# Patient Record
Sex: Male | Born: 1945 | Race: White | Hispanic: No
Health system: Southern US, Community
[De-identification: ages and names within clinical notes are randomized; demographics above are authoritative.]

---

## 1998-11-29 ENCOUNTER — Emergency Department (HOSPITAL_COMMUNITY): Admission: EM | Admit: 1998-11-29 | Discharge: 1998-11-29 | Payer: Self-pay | Admitting: Emergency Medicine

## 2002-02-19 ENCOUNTER — Inpatient Hospital Stay (HOSPITAL_COMMUNITY): Admission: EM | Admit: 2002-02-19 | Discharge: 2002-02-22 | Payer: Self-pay

## 2002-02-21 ENCOUNTER — Encounter: Payer: Self-pay | Admitting: Cardiovascular Disease

## 2002-02-21 ENCOUNTER — Encounter: Payer: Self-pay | Admitting: Family Medicine

## 2002-03-15 ENCOUNTER — Encounter: Admission: RE | Admit: 2002-03-15 | Discharge: 2002-03-15 | Payer: Self-pay | Admitting: Family Medicine

## 2002-03-16 ENCOUNTER — Encounter: Admission: RE | Admit: 2002-03-16 | Discharge: 2002-03-16 | Payer: Self-pay | Admitting: Family Medicine

## 2002-05-19 ENCOUNTER — Encounter: Payer: Self-pay | Admitting: Gastroenterology

## 2002-05-19 ENCOUNTER — Encounter: Admission: RE | Admit: 2002-05-19 | Discharge: 2002-05-19 | Payer: Self-pay | Admitting: Gastroenterology

## 2002-05-20 ENCOUNTER — Ambulatory Visit (HOSPITAL_COMMUNITY): Admission: RE | Admit: 2002-05-20 | Discharge: 2002-05-20 | Payer: Self-pay | Admitting: Gastroenterology

## 2002-07-14 ENCOUNTER — Encounter: Admission: RE | Admit: 2002-07-14 | Discharge: 2002-07-14 | Payer: Self-pay | Admitting: Sports Medicine

## 2002-08-15 ENCOUNTER — Encounter: Admission: RE | Admit: 2002-08-15 | Discharge: 2002-08-15 | Payer: Self-pay | Admitting: Family Medicine

## 2002-10-10 ENCOUNTER — Encounter: Admission: RE | Admit: 2002-10-10 | Discharge: 2002-10-10 | Payer: Self-pay | Admitting: Gastroenterology

## 2002-10-10 ENCOUNTER — Encounter: Payer: Self-pay | Admitting: Gastroenterology

## 2003-07-18 ENCOUNTER — Encounter: Admission: RE | Admit: 2003-07-18 | Discharge: 2003-07-18 | Payer: Self-pay | Admitting: Family Medicine

## 2003-07-26 ENCOUNTER — Encounter: Admission: RE | Admit: 2003-07-26 | Discharge: 2003-10-24 | Payer: Self-pay | Admitting: Family Medicine

## 2003-08-01 ENCOUNTER — Encounter: Admission: RE | Admit: 2003-08-01 | Discharge: 2003-08-01 | Payer: Self-pay | Admitting: Sports Medicine

## 2003-08-15 ENCOUNTER — Encounter: Admission: RE | Admit: 2003-08-15 | Discharge: 2003-08-15 | Payer: Self-pay | Admitting: Family Medicine

## 2003-09-13 ENCOUNTER — Ambulatory Visit: Payer: Self-pay

## 2003-10-19 ENCOUNTER — Ambulatory Visit: Payer: Self-pay | Admitting: Family Medicine

## 2003-11-02 ENCOUNTER — Inpatient Hospital Stay (HOSPITAL_COMMUNITY): Admission: RE | Admit: 2003-11-02 | Discharge: 2003-11-05 | Payer: Self-pay | Admitting: Orthopaedic Surgery

## 2004-01-25 ENCOUNTER — Ambulatory Visit: Payer: Self-pay | Admitting: Family Medicine

## 2004-02-08 ENCOUNTER — Ambulatory Visit: Payer: Self-pay | Admitting: Family Medicine

## 2004-02-16 ENCOUNTER — Ambulatory Visit: Payer: Self-pay | Admitting: Family Medicine

## 2004-02-20 ENCOUNTER — Encounter: Admission: RE | Admit: 2004-02-20 | Discharge: 2004-02-20 | Payer: Self-pay | Admitting: Gastroenterology

## 2004-02-28 ENCOUNTER — Ambulatory Visit: Payer: Self-pay | Admitting: Family Medicine

## 2004-07-02 ENCOUNTER — Ambulatory Visit: Payer: Self-pay | Admitting: Sports Medicine

## 2004-07-08 ENCOUNTER — Ambulatory Visit (HOSPITAL_COMMUNITY): Admission: RE | Admit: 2004-07-08 | Discharge: 2004-07-08 | Payer: Self-pay | Admitting: Family Medicine

## 2004-07-08 ENCOUNTER — Ambulatory Visit: Payer: Self-pay | Admitting: Family Medicine

## 2004-07-10 ENCOUNTER — Encounter: Admission: RE | Admit: 2004-07-10 | Discharge: 2004-07-10 | Payer: Self-pay | Admitting: Sports Medicine

## 2004-07-12 ENCOUNTER — Ambulatory Visit: Payer: Self-pay | Admitting: Cardiology

## 2004-07-24 ENCOUNTER — Ambulatory Visit: Payer: Self-pay | Admitting: Family Medicine

## 2004-07-26 ENCOUNTER — Ambulatory Visit: Payer: Self-pay

## 2004-07-26 ENCOUNTER — Ambulatory Visit: Payer: Self-pay | Admitting: Cardiology

## 2004-08-09 ENCOUNTER — Ambulatory Visit: Payer: Self-pay | Admitting: Cardiology

## 2004-08-16 ENCOUNTER — Ambulatory Visit: Payer: Self-pay | Admitting: Family Medicine

## 2004-09-04 ENCOUNTER — Ambulatory Visit: Payer: Self-pay | Admitting: Family Medicine

## 2004-10-03 ENCOUNTER — Ambulatory Visit: Payer: Self-pay | Admitting: Cardiology

## 2005-01-30 ENCOUNTER — Ambulatory Visit: Payer: Self-pay | Admitting: Sports Medicine

## 2005-02-07 ENCOUNTER — Ambulatory Visit (HOSPITAL_COMMUNITY): Admission: RE | Admit: 2005-02-07 | Discharge: 2005-02-07 | Payer: Self-pay | Admitting: Family Medicine

## 2005-02-10 ENCOUNTER — Ambulatory Visit: Payer: Self-pay | Admitting: Family Medicine

## 2005-03-05 ENCOUNTER — Ambulatory Visit: Payer: Self-pay | Admitting: Family Medicine

## 2005-04-03 ENCOUNTER — Emergency Department (HOSPITAL_COMMUNITY): Admission: EM | Admit: 2005-04-03 | Discharge: 2005-04-03 | Payer: Self-pay | Admitting: Emergency Medicine

## 2005-07-18 ENCOUNTER — Ambulatory Visit (HOSPITAL_COMMUNITY): Admission: RE | Admit: 2005-07-18 | Discharge: 2005-07-18 | Payer: Self-pay | Admitting: Family Medicine

## 2005-07-18 ENCOUNTER — Ambulatory Visit: Payer: Self-pay | Admitting: Family Medicine

## 2005-08-20 ENCOUNTER — Ambulatory Visit: Payer: Self-pay | Admitting: Family Medicine

## 2005-08-21 ENCOUNTER — Ambulatory Visit: Payer: Self-pay | Admitting: Cardiology

## 2005-08-21 ENCOUNTER — Ambulatory Visit (HOSPITAL_COMMUNITY): Admission: RE | Admit: 2005-08-21 | Discharge: 2005-08-21 | Payer: Self-pay | Admitting: Cardiology

## 2005-09-03 ENCOUNTER — Ambulatory Visit: Payer: Self-pay | Admitting: Oncology

## 2005-09-12 LAB — COMPREHENSIVE METABOLIC PANEL
ALT: 48 U/L — ABNORMAL HIGH (ref 0–40)
AST: 66 U/L — ABNORMAL HIGH (ref 0–37)
Chloride: 101 mEq/L (ref 96–112)
Creatinine, Ser: 0.82 mg/dL (ref 0.40–1.50)
Sodium: 137 mEq/L (ref 135–145)
Total Bilirubin: 2.5 mg/dL — ABNORMAL HIGH (ref 0.3–1.2)
Total Protein: 8 g/dL (ref 6.0–8.3)

## 2005-09-12 LAB — CBC WITH DIFFERENTIAL/PLATELET
BASO%: 0.3 % (ref 0.0–2.0)
Basophils Absolute: 0 10*3/uL (ref 0.0–0.1)
HCT: 37 % — ABNORMAL LOW (ref 38.7–49.9)
LYMPH%: 52.8 % — ABNORMAL HIGH (ref 14.0–48.0)
MCH: 32.3 pg (ref 28.0–33.4)
MCHC: 34.7 g/dL (ref 32.0–35.9)
MONO#: 0.5 10*3/uL (ref 0.1–0.9)
NEUT%: 34.9 % — ABNORMAL LOW (ref 40.0–75.0)
Platelets: 88 10*3/uL — ABNORMAL LOW (ref 145–400)
WBC: 5.7 10*3/uL (ref 4.0–10.0)

## 2005-09-12 LAB — CHCC SMEAR

## 2005-09-12 LAB — TECHNOLOGIST REVIEW

## 2005-10-07 ENCOUNTER — Ambulatory Visit: Payer: Self-pay | Admitting: *Deleted

## 2005-10-17 ENCOUNTER — Ambulatory Visit: Payer: Self-pay

## 2005-10-30 ENCOUNTER — Ambulatory Visit: Payer: Self-pay | Admitting: Cardiology

## 2006-01-27 ENCOUNTER — Ambulatory Visit (HOSPITAL_BASED_OUTPATIENT_CLINIC_OR_DEPARTMENT_OTHER): Admission: RE | Admit: 2006-01-27 | Discharge: 2006-01-27 | Payer: Self-pay | Admitting: Orthopaedic Surgery

## 2006-02-10 ENCOUNTER — Encounter (INDEPENDENT_AMBULATORY_CARE_PROVIDER_SITE_OTHER): Payer: Self-pay | Admitting: Family Medicine

## 2006-02-10 ENCOUNTER — Ambulatory Visit: Payer: Self-pay | Admitting: Family Medicine

## 2006-02-10 LAB — CONVERTED CEMR LAB
INR: 1.2 (ref 0.0–1.5)
aPTT: 32 s (ref 24–37)

## 2006-02-15 ENCOUNTER — Emergency Department (HOSPITAL_COMMUNITY): Admission: EM | Admit: 2006-02-15 | Discharge: 2006-02-15 | Payer: Self-pay | Admitting: Emergency Medicine

## 2006-02-19 ENCOUNTER — Ambulatory Visit: Payer: Self-pay | Admitting: Family Medicine

## 2006-02-24 ENCOUNTER — Ambulatory Visit: Payer: Self-pay | Admitting: *Deleted

## 2006-02-24 ENCOUNTER — Ambulatory Visit (HOSPITAL_COMMUNITY): Admission: RE | Admit: 2006-02-24 | Discharge: 2006-02-24 | Payer: Self-pay | Admitting: Sports Medicine

## 2006-03-13 ENCOUNTER — Ambulatory Visit: Payer: Self-pay | Admitting: Sports Medicine

## 2006-03-13 DIAGNOSIS — I739 Peripheral vascular disease, unspecified: Secondary | ICD-10-CM

## 2006-03-17 ENCOUNTER — Encounter (INDEPENDENT_AMBULATORY_CARE_PROVIDER_SITE_OTHER): Payer: Self-pay | Admitting: Family Medicine

## 2006-03-18 ENCOUNTER — Ambulatory Visit (HOSPITAL_COMMUNITY): Admission: RE | Admit: 2006-03-18 | Discharge: 2006-03-18 | Payer: Self-pay | Admitting: Gastroenterology

## 2006-03-18 ENCOUNTER — Encounter (INDEPENDENT_AMBULATORY_CARE_PROVIDER_SITE_OTHER): Payer: Self-pay | Admitting: Specialist

## 2006-04-15 ENCOUNTER — Ambulatory Visit: Payer: Self-pay | Admitting: Family Medicine

## 2006-04-15 DIAGNOSIS — I10 Essential (primary) hypertension: Secondary | ICD-10-CM | POA: Insufficient documentation

## 2006-04-30 ENCOUNTER — Ambulatory Visit: Payer: Self-pay | Admitting: Cardiology

## 2006-07-23 ENCOUNTER — Telehealth: Payer: Self-pay | Admitting: *Deleted

## 2006-07-24 ENCOUNTER — Encounter (INDEPENDENT_AMBULATORY_CARE_PROVIDER_SITE_OTHER): Payer: Self-pay | Admitting: Family Medicine

## 2006-07-24 ENCOUNTER — Ambulatory Visit: Payer: Self-pay | Admitting: Family Medicine

## 2006-07-24 DIAGNOSIS — I872 Venous insufficiency (chronic) (peripheral): Secondary | ICD-10-CM | POA: Insufficient documentation

## 2006-07-24 DIAGNOSIS — D696 Thrombocytopenia, unspecified: Secondary | ICD-10-CM | POA: Insufficient documentation

## 2006-07-28 LAB — CONVERTED CEMR LAB
BUN: 14 mg/dL (ref 6–23)
Chloride: 100 meq/L (ref 96–112)
Creatinine, Ser: 0.87 mg/dL (ref 0.40–1.50)
MCHC: 32.9 g/dL (ref 30.0–36.0)
Platelets: 75 10*3/uL — ABNORMAL LOW (ref 150–400)
Potassium: 4.1 meq/L (ref 3.5–5.3)
RDW: 14.8 % — ABNORMAL HIGH (ref 11.5–14.0)

## 2006-08-05 ENCOUNTER — Encounter (INDEPENDENT_AMBULATORY_CARE_PROVIDER_SITE_OTHER): Payer: Self-pay | Admitting: Family Medicine

## 2006-08-05 ENCOUNTER — Telehealth: Payer: Self-pay | Admitting: *Deleted

## 2006-08-05 ENCOUNTER — Ambulatory Visit: Payer: Self-pay | Admitting: Family Medicine

## 2006-08-05 LAB — CONVERTED CEMR LAB
BUN: 13 mg/dL (ref 6–23)
Chloride: 105 meq/L (ref 96–112)
Glucose, Bld: 93 mg/dL (ref 70–99)
Potassium: 4.1 meq/L (ref 3.5–5.3)
Sodium: 138 meq/L (ref 135–145)

## 2006-08-06 ENCOUNTER — Telehealth: Payer: Self-pay | Admitting: *Deleted

## 2006-08-28 ENCOUNTER — Encounter (HOSPITAL_BASED_OUTPATIENT_CLINIC_OR_DEPARTMENT_OTHER): Admission: RE | Admit: 2006-08-28 | Discharge: 2006-09-10 | Payer: Self-pay | Admitting: Surgery

## 2006-09-01 ENCOUNTER — Encounter (INDEPENDENT_AMBULATORY_CARE_PROVIDER_SITE_OTHER): Payer: Self-pay | Admitting: Family Medicine

## 2006-09-01 ENCOUNTER — Ambulatory Visit: Payer: Self-pay | Admitting: Family Medicine

## 2006-09-01 LAB — CONVERTED CEMR LAB
CO2: 25 meq/L (ref 19–32)
Calcium: 8.9 mg/dL (ref 8.4–10.5)
Creatinine, Ser: 0.77 mg/dL (ref 0.40–1.50)
Glucose, Bld: 88 mg/dL (ref 70–99)
HCT: 41.5 % (ref 39.0–52.0)
MCHC: 33.5 g/dL (ref 30.0–36.0)
MCV: 90.2 fL (ref 78.0–100.0)
RBC: 4.6 M/uL (ref 4.22–5.81)
Sodium: 138 meq/L (ref 135–145)
WBC: 5.2 10*3/uL (ref 4.0–10.5)

## 2006-09-30 ENCOUNTER — Ambulatory Visit: Payer: Self-pay | Admitting: Family Medicine

## 2006-10-29 ENCOUNTER — Ambulatory Visit: Payer: Self-pay | Admitting: Family Medicine

## 2006-10-29 ENCOUNTER — Encounter (INDEPENDENT_AMBULATORY_CARE_PROVIDER_SITE_OTHER): Payer: Self-pay | Admitting: Family Medicine

## 2006-10-29 DIAGNOSIS — B182 Chronic viral hepatitis C: Secondary | ICD-10-CM

## 2006-10-29 DIAGNOSIS — K766 Portal hypertension: Secondary | ICD-10-CM | POA: Insufficient documentation

## 2006-10-29 LAB — CONVERTED CEMR LAB
Hemoglobin: 13.2 g/dL (ref 13.0–17.0)
MCHC: 32.2 g/dL (ref 30.0–36.0)
PSA: NORMAL ng/mL
PSA: 0.29 ng/mL (ref 0.10–4.00)
RDW: 14.9 % — ABNORMAL HIGH (ref 11.5–14.0)

## 2006-11-02 ENCOUNTER — Encounter (INDEPENDENT_AMBULATORY_CARE_PROVIDER_SITE_OTHER): Payer: Self-pay | Admitting: Family Medicine

## 2006-11-06 ENCOUNTER — Encounter (INDEPENDENT_AMBULATORY_CARE_PROVIDER_SITE_OTHER): Payer: Self-pay | Admitting: Family Medicine

## 2006-11-16 ENCOUNTER — Encounter (INDEPENDENT_AMBULATORY_CARE_PROVIDER_SITE_OTHER): Payer: Self-pay | Admitting: Family Medicine

## 2006-12-10 ENCOUNTER — Encounter (INDEPENDENT_AMBULATORY_CARE_PROVIDER_SITE_OTHER): Payer: Self-pay | Admitting: Family Medicine

## 2006-12-15 ENCOUNTER — Ambulatory Visit: Payer: Self-pay | Admitting: Family Medicine

## 2007-02-02 ENCOUNTER — Ambulatory Visit: Payer: Self-pay | Admitting: Family Medicine

## 2007-02-02 ENCOUNTER — Encounter (INDEPENDENT_AMBULATORY_CARE_PROVIDER_SITE_OTHER): Payer: Self-pay | Admitting: *Deleted

## 2007-02-02 DIAGNOSIS — J449 Chronic obstructive pulmonary disease, unspecified: Secondary | ICD-10-CM

## 2007-02-02 DIAGNOSIS — J4489 Other specified chronic obstructive pulmonary disease: Secondary | ICD-10-CM | POA: Insufficient documentation

## 2007-02-03 ENCOUNTER — Encounter (INDEPENDENT_AMBULATORY_CARE_PROVIDER_SITE_OTHER): Payer: Self-pay | Admitting: *Deleted

## 2007-02-03 LAB — CONVERTED CEMR LAB
Albumin: 3.8 g/dL (ref 3.5–5.2)
Alkaline Phosphatase: 63 units/L (ref 39–117)
BUN: 18 mg/dL (ref 6–23)
Calcium: 9 mg/dL (ref 8.4–10.5)
Chloride: 98 meq/L (ref 96–112)
Eosinophils Absolute: 0.1 10*3/uL (ref 0.0–0.7)
Glucose, Bld: 105 mg/dL — ABNORMAL HIGH (ref 70–99)
Lymphocytes Relative: 42 % (ref 12–46)
Lymphs Abs: 2.9 10*3/uL (ref 0.7–4.0)
MCV: 91.5 fL (ref 78.0–100.0)
Monocytes Relative: 13 % — ABNORMAL HIGH (ref 3–12)
Neutro Abs: 3 10*3/uL (ref 1.7–7.7)
Neutrophils Relative %: 43 % (ref 43–77)
Platelets: 84 10*3/uL — ABNORMAL LOW (ref 150–400)
Potassium: 4.4 meq/L (ref 3.5–5.3)
RBC: 4.22 M/uL (ref 4.22–5.81)
Sodium: 136 meq/L (ref 135–145)
Total Protein: 8.7 g/dL — ABNORMAL HIGH (ref 6.0–8.3)
WBC: 6.9 10*3/uL (ref 4.0–10.5)

## 2007-02-22 ENCOUNTER — Telehealth: Payer: Self-pay | Admitting: *Deleted

## 2007-02-26 ENCOUNTER — Ambulatory Visit: Payer: Self-pay | Admitting: Family Medicine

## 2007-02-26 DIAGNOSIS — K7030 Alcoholic cirrhosis of liver without ascites: Secondary | ICD-10-CM

## 2007-02-26 DIAGNOSIS — F411 Generalized anxiety disorder: Secondary | ICD-10-CM | POA: Insufficient documentation

## 2007-03-01 ENCOUNTER — Ambulatory Visit: Payer: Self-pay | Admitting: Sports Medicine

## 2007-03-01 ENCOUNTER — Encounter (INDEPENDENT_AMBULATORY_CARE_PROVIDER_SITE_OTHER): Payer: Self-pay | Admitting: Family Medicine

## 2007-03-01 ENCOUNTER — Encounter (INDEPENDENT_AMBULATORY_CARE_PROVIDER_SITE_OTHER): Payer: Self-pay | Admitting: *Deleted

## 2007-03-02 ENCOUNTER — Encounter (INDEPENDENT_AMBULATORY_CARE_PROVIDER_SITE_OTHER): Payer: Self-pay | Admitting: *Deleted

## 2007-03-02 LAB — CONVERTED CEMR LAB
ALT: 40 units/L (ref 0–53)
AST: 54 units/L — ABNORMAL HIGH (ref 0–37)
Basophils Relative: 0 % (ref 0–1)
Creatinine, Ser: 0.99 mg/dL (ref 0.40–1.50)
Eosinophils Absolute: 0.1 10*3/uL (ref 0.0–0.7)
INR: 1.2 (ref 0.0–1.5)
Lymphs Abs: 2.6 10*3/uL (ref 0.7–4.0)
MCHC: 33.8 g/dL (ref 30.0–36.0)
MCV: 92.2 fL (ref 78.0–100.0)
Neutro Abs: 2.3 10*3/uL (ref 1.7–7.7)
Neutrophils Relative %: 42 % — ABNORMAL LOW (ref 43–77)
Platelets: 63 10*3/uL — ABNORMAL LOW (ref 150–400)
Prothrombin Time: 15.2 s (ref 11.6–15.2)
Sodium: 136 meq/L (ref 135–145)
Total Bilirubin: 2.1 mg/dL — ABNORMAL HIGH (ref 0.3–1.2)
WBC: 5.3 10*3/uL (ref 4.0–10.5)

## 2007-03-26 ENCOUNTER — Ambulatory Visit: Payer: Self-pay | Admitting: Family Medicine

## 2007-03-26 DIAGNOSIS — L821 Other seborrheic keratosis: Secondary | ICD-10-CM | POA: Insufficient documentation

## 2007-03-26 DIAGNOSIS — R413 Other amnesia: Secondary | ICD-10-CM

## 2007-03-26 DIAGNOSIS — D179 Benign lipomatous neoplasm, unspecified: Secondary | ICD-10-CM | POA: Insufficient documentation

## 2007-03-29 ENCOUNTER — Ambulatory Visit: Payer: Self-pay | Admitting: Sports Medicine

## 2007-03-29 ENCOUNTER — Encounter (INDEPENDENT_AMBULATORY_CARE_PROVIDER_SITE_OTHER): Payer: Self-pay | Admitting: *Deleted

## 2007-03-29 LAB — CONVERTED CEMR LAB
AST: 83 units/L — ABNORMAL HIGH (ref 0–37)
Albumin: 3.7 g/dL (ref 3.5–5.2)
BUN: 12 mg/dL (ref 6–23)
CO2: 27 meq/L (ref 19–32)
Calcium: 9 mg/dL (ref 8.4–10.5)
Chloride: 106 meq/L (ref 96–112)
Glucose, Bld: 102 mg/dL — ABNORMAL HIGH (ref 70–99)
Potassium: 4.6 meq/L (ref 3.5–5.3)

## 2007-03-30 ENCOUNTER — Encounter (INDEPENDENT_AMBULATORY_CARE_PROVIDER_SITE_OTHER): Payer: Self-pay | Admitting: *Deleted

## 2007-04-06 ENCOUNTER — Encounter: Payer: Self-pay | Admitting: Family Medicine

## 2007-04-06 ENCOUNTER — Ambulatory Visit: Payer: Self-pay | Admitting: Cardiology

## 2007-04-06 ENCOUNTER — Inpatient Hospital Stay (HOSPITAL_COMMUNITY): Admission: EM | Admit: 2007-04-06 | Discharge: 2007-04-16 | Payer: Self-pay | Admitting: Emergency Medicine

## 2007-04-06 ENCOUNTER — Ambulatory Visit: Payer: Self-pay | Admitting: Internal Medicine

## 2007-04-06 ENCOUNTER — Encounter (INDEPENDENT_AMBULATORY_CARE_PROVIDER_SITE_OTHER): Payer: Self-pay | Admitting: *Deleted

## 2007-04-07 ENCOUNTER — Encounter (INDEPENDENT_AMBULATORY_CARE_PROVIDER_SITE_OTHER): Payer: Self-pay | Admitting: Internal Medicine

## 2007-04-07 ENCOUNTER — Ambulatory Visit: Payer: Self-pay | Admitting: Vascular Surgery

## 2007-04-09 ENCOUNTER — Encounter: Payer: Self-pay | Admitting: Cardiology

## 2007-04-15 ENCOUNTER — Telehealth: Payer: Self-pay | Admitting: Family Medicine

## 2007-04-21 ENCOUNTER — Ambulatory Visit: Payer: Self-pay | Admitting: Family Medicine

## 2007-04-21 ENCOUNTER — Encounter (INDEPENDENT_AMBULATORY_CARE_PROVIDER_SITE_OTHER): Payer: Self-pay | Admitting: *Deleted

## 2007-04-21 DIAGNOSIS — I2510 Atherosclerotic heart disease of native coronary artery without angina pectoris: Secondary | ICD-10-CM | POA: Insufficient documentation

## 2007-04-23 LAB — CONVERTED CEMR LAB
AST: 96 units/L — ABNORMAL HIGH (ref 0–37)
Albumin: 3.6 g/dL (ref 3.5–5.2)
Alkaline Phosphatase: 80 units/L (ref 39–117)
Basophils Absolute: 0 10*3/uL (ref 0.0–0.1)
Glucose, Bld: 97 mg/dL (ref 70–99)
Hemoglobin: 13.3 g/dL (ref 13.0–17.0)
Lymphocytes Relative: 39 % (ref 12–46)
Monocytes Absolute: 0.5 10*3/uL (ref 0.1–1.0)
Neutro Abs: 4.5 10*3/uL (ref 1.7–7.7)
Potassium: 5.4 meq/L — ABNORMAL HIGH (ref 3.5–5.3)
RBC: 4.2 M/uL — ABNORMAL LOW (ref 4.22–5.81)
RDW: 15 % (ref 11.5–15.5)
Sodium: 140 meq/L (ref 135–145)
Total Protein: 8.8 g/dL — ABNORMAL HIGH (ref 6.0–8.3)
WBC: 8.3 10*3/uL (ref 4.0–10.5)

## 2007-05-26 ENCOUNTER — Ambulatory Visit: Payer: Self-pay | Admitting: Family Medicine

## 2007-06-16 ENCOUNTER — Encounter (INDEPENDENT_AMBULATORY_CARE_PROVIDER_SITE_OTHER): Payer: Self-pay | Admitting: *Deleted

## 2007-07-08 ENCOUNTER — Ambulatory Visit: Payer: Self-pay | Admitting: Family Medicine

## 2007-07-16 ENCOUNTER — Telehealth: Payer: Self-pay | Admitting: Family Medicine

## 2007-09-21 ENCOUNTER — Telehealth (INDEPENDENT_AMBULATORY_CARE_PROVIDER_SITE_OTHER): Payer: Self-pay | Admitting: *Deleted

## 2007-09-24 ENCOUNTER — Telehealth: Payer: Self-pay | Admitting: *Deleted

## 2007-11-02 ENCOUNTER — Telehealth: Payer: Self-pay | Admitting: *Deleted

## 2007-11-15 ENCOUNTER — Ambulatory Visit: Payer: Self-pay | Admitting: Family Medicine

## 2007-11-15 ENCOUNTER — Encounter: Payer: Self-pay | Admitting: Family Medicine

## 2007-11-17 LAB — CONVERTED CEMR LAB
ALT: 105 units/L — ABNORMAL HIGH (ref 0–53)
AST: 130 units/L — ABNORMAL HIGH (ref 0–37)
Basophils Absolute: 0 10*3/uL (ref 0.0–0.1)
Basophils Relative: 0 % (ref 0–1)
Creatinine, Ser: 0.89 mg/dL (ref 0.40–1.50)
Eosinophils Absolute: 0.2 10*3/uL (ref 0.0–0.7)
INR: 1.2 (ref 0.0–1.5)
MCHC: 32.4 g/dL (ref 30.0–36.0)
MCV: 90.5 fL (ref 78.0–100.0)
Neutrophils Relative %: 54 % (ref 43–77)
Platelets: 86 10*3/uL — ABNORMAL LOW (ref 150–400)
Prothrombin Time: 16 s — ABNORMAL HIGH (ref 11.6–15.2)
RDW: 15.6 % — ABNORMAL HIGH (ref 11.5–15.5)
Total Bilirubin: 1.4 mg/dL — ABNORMAL HIGH (ref 0.3–1.2)
WBC: 4.3 10*3/uL (ref 4.0–10.5)

## 2007-11-19 ENCOUNTER — Encounter: Payer: Self-pay | Admitting: Family Medicine

## 2007-11-30 ENCOUNTER — Ambulatory Visit: Payer: Self-pay | Admitting: Family Medicine

## 2007-11-30 DIAGNOSIS — Z860100 Personal history of colon polyps, unspecified: Secondary | ICD-10-CM | POA: Insufficient documentation

## 2007-11-30 LAB — CONVERTED CEMR LAB

## 2007-12-02 ENCOUNTER — Inpatient Hospital Stay (HOSPITAL_COMMUNITY): Admission: EM | Admit: 2007-12-02 | Discharge: 2007-12-10 | Payer: Self-pay | Admitting: Emergency Medicine

## 2007-12-02 ENCOUNTER — Ambulatory Visit: Payer: Self-pay | Admitting: Family Medicine

## 2007-12-02 ENCOUNTER — Encounter: Payer: Self-pay | Admitting: Family Medicine

## 2007-12-05 ENCOUNTER — Ambulatory Visit: Payer: Self-pay | Admitting: Gastroenterology

## 2007-12-16 ENCOUNTER — Ambulatory Visit: Payer: Self-pay | Admitting: Family Medicine

## 2007-12-16 DIAGNOSIS — R197 Diarrhea, unspecified: Secondary | ICD-10-CM

## 2007-12-16 DIAGNOSIS — F329 Major depressive disorder, single episode, unspecified: Secondary | ICD-10-CM

## 2007-12-16 DIAGNOSIS — K810 Acute cholecystitis: Secondary | ICD-10-CM | POA: Insufficient documentation

## 2007-12-21 ENCOUNTER — Ambulatory Visit: Payer: Self-pay | Admitting: Family Medicine

## 2007-12-21 ENCOUNTER — Telehealth: Payer: Self-pay | Admitting: *Deleted

## 2007-12-21 ENCOUNTER — Telehealth: Payer: Self-pay | Admitting: Family Medicine

## 2007-12-21 ENCOUNTER — Inpatient Hospital Stay (HOSPITAL_COMMUNITY): Admission: RE | Admit: 2007-12-21 | Discharge: 2008-01-06 | Payer: Self-pay | Admitting: Family Medicine

## 2007-12-21 ENCOUNTER — Encounter: Payer: Self-pay | Admitting: Family Medicine

## 2007-12-21 DIAGNOSIS — N179 Acute kidney failure, unspecified: Secondary | ICD-10-CM

## 2007-12-27 ENCOUNTER — Encounter: Payer: Self-pay | Admitting: Family Medicine

## 2007-12-28 ENCOUNTER — Ambulatory Visit: Payer: Self-pay | Admitting: Infectious Diseases

## 2008-01-03 ENCOUNTER — Encounter: Payer: Self-pay | Admitting: *Deleted

## 2008-01-06 ENCOUNTER — Encounter: Payer: Self-pay | Admitting: Family Medicine

## 2008-01-10 ENCOUNTER — Telehealth (INDEPENDENT_AMBULATORY_CARE_PROVIDER_SITE_OTHER): Payer: Self-pay | Admitting: Family Medicine

## 2008-01-10 ENCOUNTER — Ambulatory Visit (HOSPITAL_COMMUNITY): Admission: RE | Admit: 2008-01-10 | Discharge: 2008-01-10 | Payer: Self-pay | Admitting: Interventional Radiology

## 2008-01-13 ENCOUNTER — Ambulatory Visit: Payer: Self-pay | Admitting: Family Medicine

## 2008-01-13 ENCOUNTER — Encounter: Payer: Self-pay | Admitting: Family Medicine

## 2008-01-18 ENCOUNTER — Ambulatory Visit: Payer: Self-pay | Admitting: Family Medicine

## 2008-01-18 DIAGNOSIS — K859 Acute pancreatitis without necrosis or infection, unspecified: Secondary | ICD-10-CM | POA: Insufficient documentation

## 2008-01-18 LAB — CONVERTED CEMR LAB
ALT: 136 units/L — ABNORMAL HIGH (ref 0–53)
Albumin: 2.7 g/dL — ABNORMAL LOW (ref 3.5–5.2)
Alkaline Phosphatase: 107 units/L (ref 39–117)
CO2: 18 meq/L — ABNORMAL LOW (ref 19–32)
Glucose, Bld: 81 mg/dL (ref 70–99)
Potassium: 4.9 meq/L (ref 3.5–5.3)
Sodium: 138 meq/L (ref 135–145)
Total Protein: 7.6 g/dL (ref 6.0–8.3)

## 2008-01-19 ENCOUNTER — Encounter: Payer: Self-pay | Admitting: Family Medicine

## 2008-01-19 ENCOUNTER — Ambulatory Visit: Payer: Self-pay | Admitting: Family Medicine

## 2008-01-19 ENCOUNTER — Inpatient Hospital Stay (HOSPITAL_COMMUNITY): Admission: EM | Admit: 2008-01-19 | Discharge: 2008-01-27 | Payer: Self-pay | Admitting: Emergency Medicine

## 2008-01-19 DIAGNOSIS — R4182 Altered mental status, unspecified: Secondary | ICD-10-CM | POA: Insufficient documentation

## 2008-01-19 DIAGNOSIS — E875 Hyperkalemia: Secondary | ICD-10-CM

## 2008-01-28 ENCOUNTER — Telehealth (INDEPENDENT_AMBULATORY_CARE_PROVIDER_SITE_OTHER): Payer: Self-pay | Admitting: *Deleted

## 2008-01-29 ENCOUNTER — Telehealth (INDEPENDENT_AMBULATORY_CARE_PROVIDER_SITE_OTHER): Payer: Self-pay | Admitting: Family Medicine

## 2008-01-31 ENCOUNTER — Encounter: Payer: Self-pay | Admitting: Family Medicine

## 2008-02-03 ENCOUNTER — Encounter: Payer: Self-pay | Admitting: Family Medicine

## 2008-02-03 DIAGNOSIS — Z789 Other specified health status: Secondary | ICD-10-CM | POA: Insufficient documentation

## 2008-02-04 ENCOUNTER — Telehealth: Payer: Self-pay | Admitting: Family Medicine

## 2008-02-07 DEATH — deceased

## 2008-02-21 ENCOUNTER — Encounter: Payer: Self-pay | Admitting: Family Medicine

## 2009-01-09 IMAGING — US US ABDOMEN COMPLETE
1 series · 13 of 25 positions shown · non-contrast
Comparison: 12/04/2007

CLINICAL DATA: Vomiting.  Cholecystostomy tube placement
12/06/2007. Cirrhosis.

ABDOMEN ULTRASOUND
TECHNIQUE: Complete abdominal ultrasound examination was performed
including evaluation of the liver, gallbladder, bile ducts,
pancreas, kidneys, spleen, IVC, and abdominal aorta.

[Series 1: unknown · 0.33mm/px · 13 of 52 slices shown]
[im 1/52]
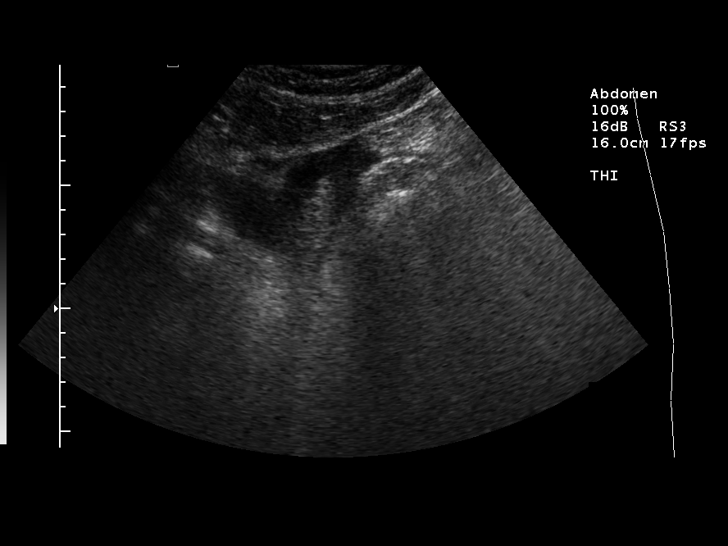
[im 5/52]
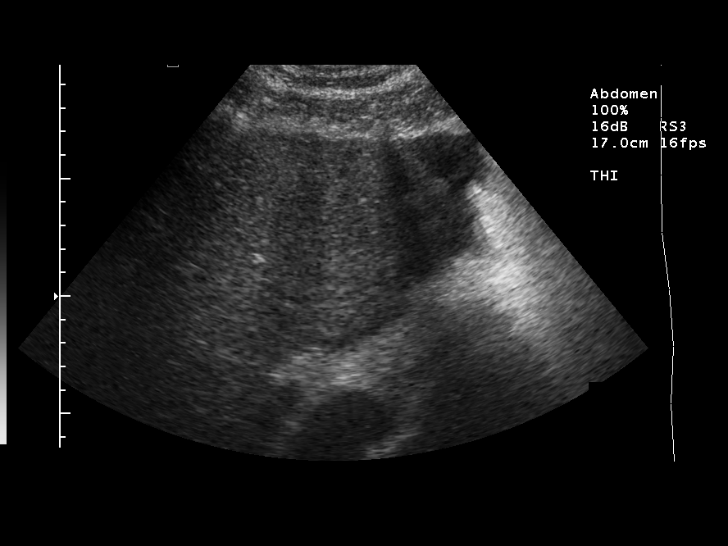
[im 9/52]
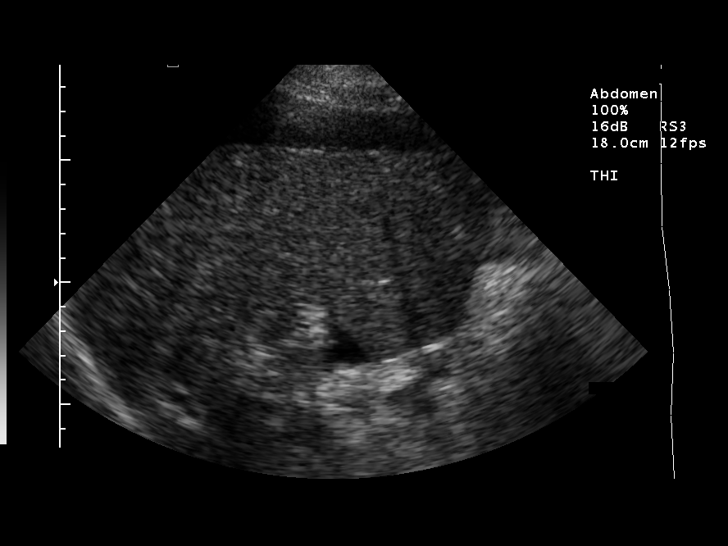
[im 13/52]
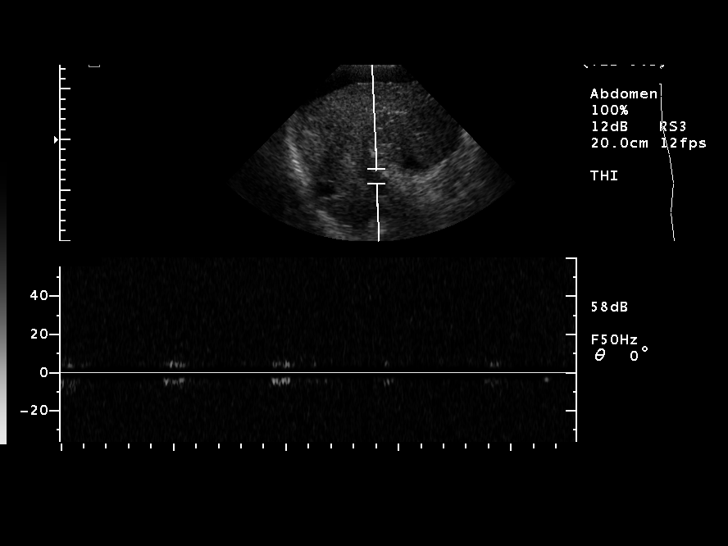
[im 18/52]
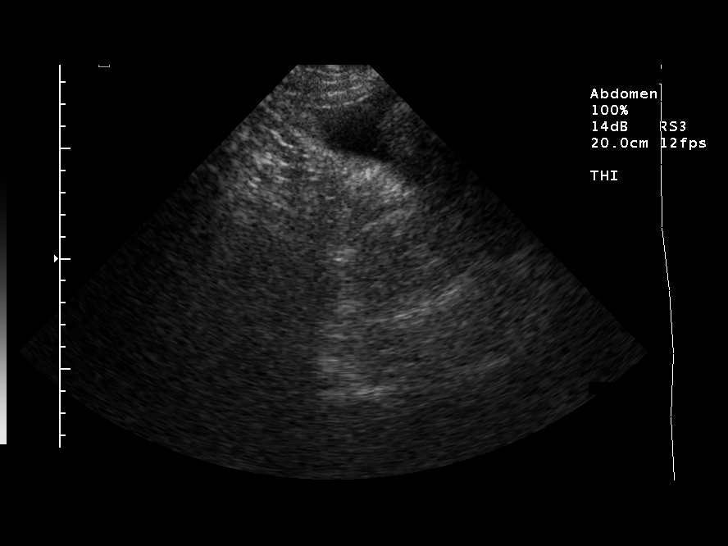
[im 22/52]
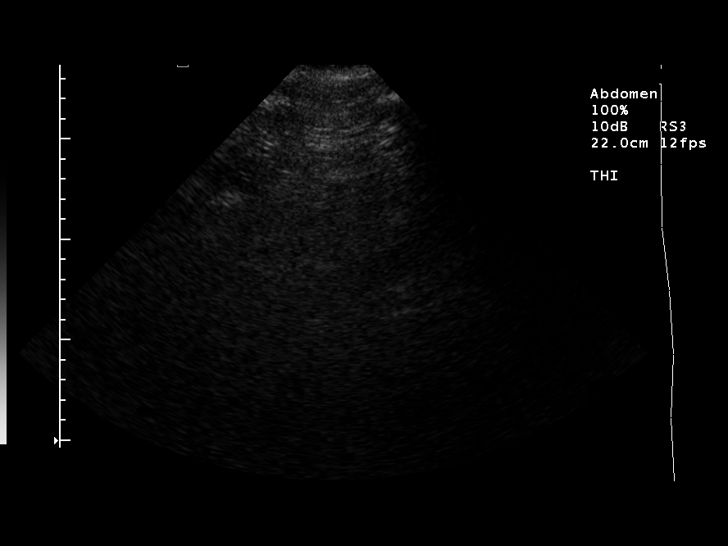
[im 26/52]
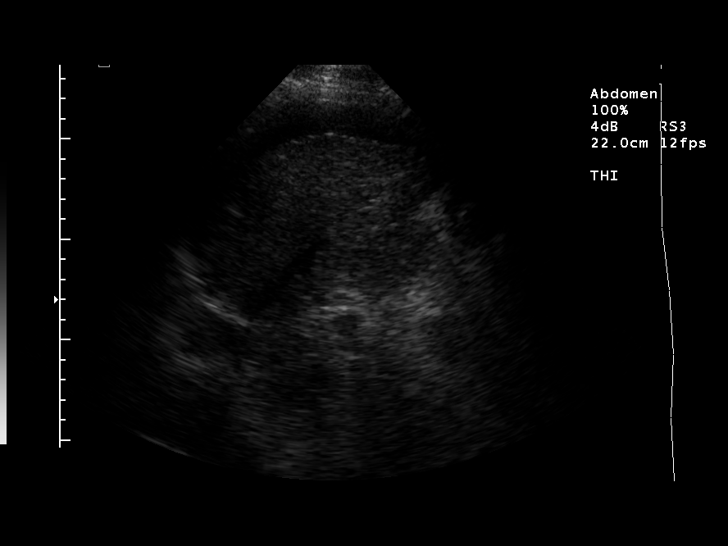
[im 30/52]
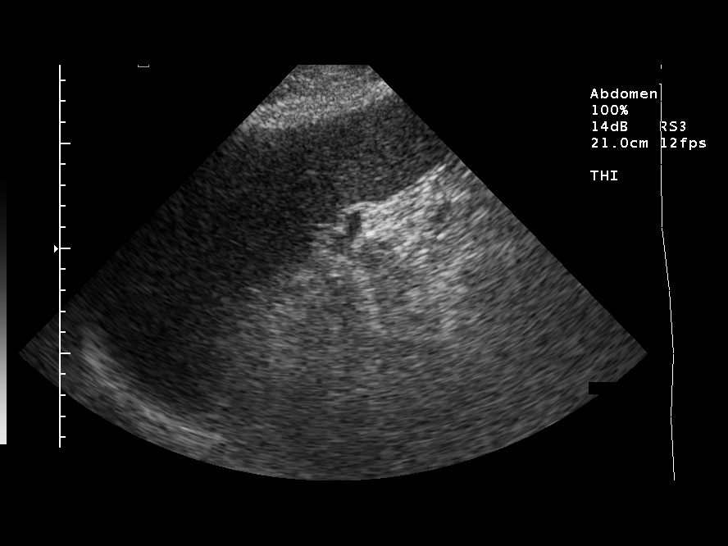
[im 35/52]
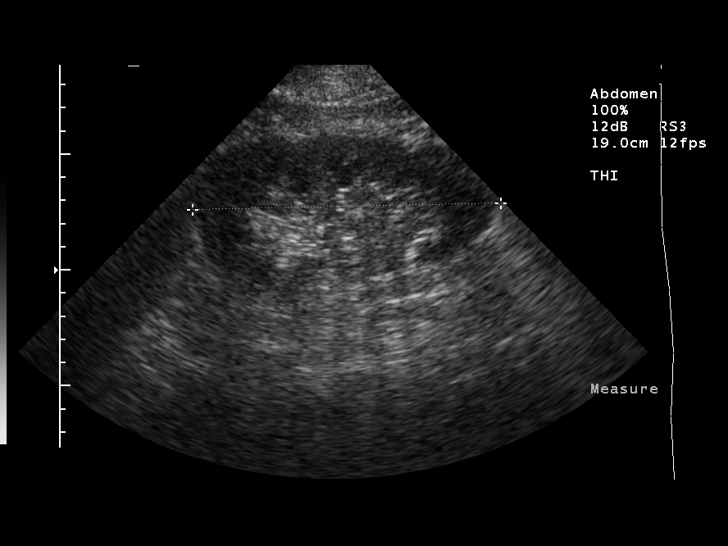
[im 39/52]
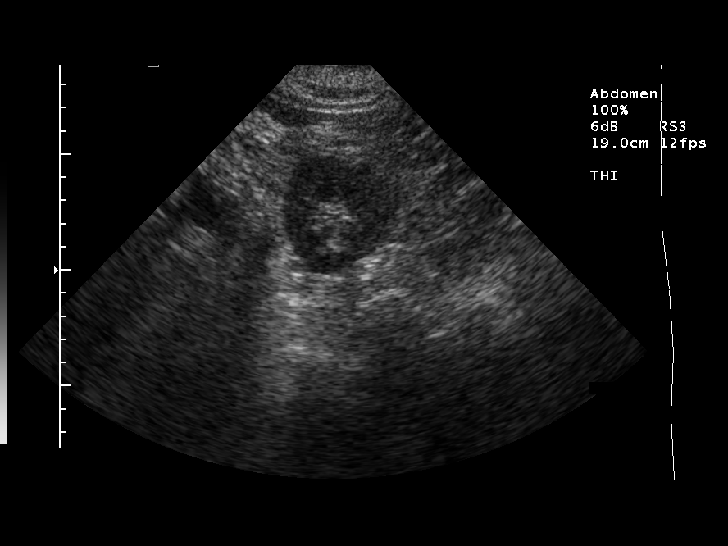
[im 43/52]
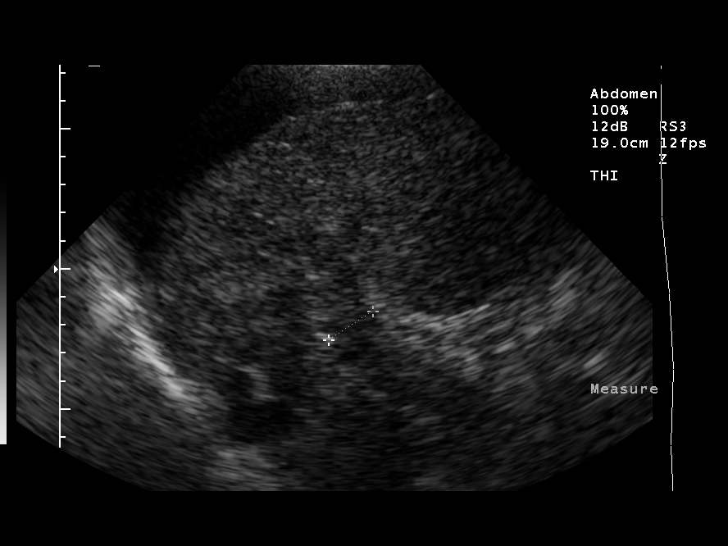
[im 47/52]
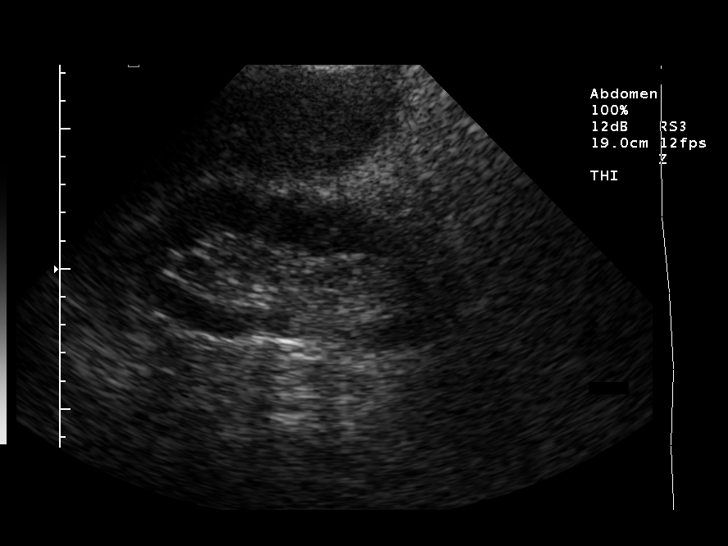
[im 52/52]
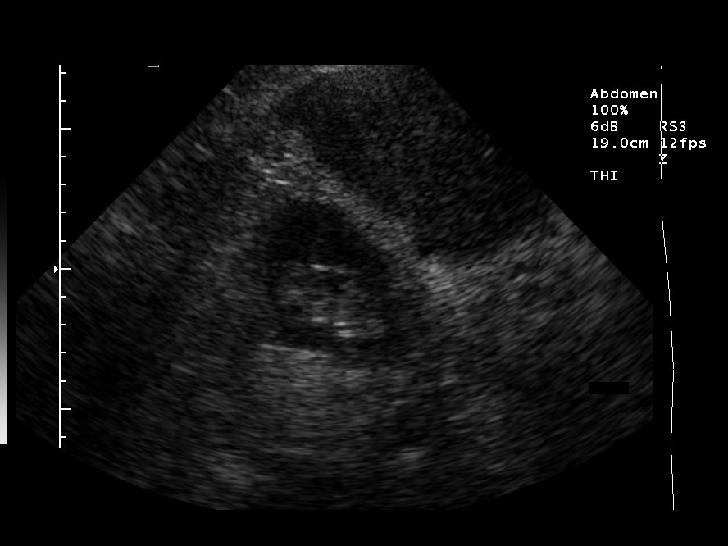

[13 of 25 positions shown; findings below may reference images not displayed]

FINDINGS: Gallbladder:  The gallbladder remains decompressed.  There is
concomitant apparent wall thickening up to 4.2 mm.

Common bile duct: Measures up to 7.4 mm diameter.

Liver:  Heterogeneous coarse echotexture with a somewhat nodular
contour.  Occlusion of the main portal vein is confirmed on color
and power Doppler.  No focal parenchymal abnormalities.

Inferior vena cava:  Patent.

Pancreas:  Largely obscured by overlying bowel gas.

Spleen:  Enlarged up to 19.2 cm in length without focal parenchymal
abnormalities.

Right kidney:  No hydronephrosis.   Normal parenchymal echotexture
without focal abnormalities.

Left kidney:  No hydronephrosis. Normal parenchymal echotexture
without focal abnormalities.

Abdominal aorta:  Visualized portions normal in caliber,
unremarkable.

A moderate amount of perihepatic ascites is noted.
IMPRESSION: 1.  Decompression of the gallbladder by the cholecystostomy
catheter.
2.  Hepatic parenchymal changes, splenomegaly, portal vein
occlusion, and ascites consistent with cirrhosis and portal venous
hypertension.

## 2009-01-28 IMAGING — XA IR CHOLANGIOGRAM VIA EXIST CATHETER
1 series · 13 of 24 positions shown · non-contrast
Comparison: Cholangiogram through existing tube on 12/30/2007

CLINICAL DATA: Follow up cholecystostomy tube.  Evaluate for
drainage into the duodenum.

CHOLANGIOGRAM VIA EXISTING CATHETER

[Series 1: run · 13 of 36 slices shown]
[im 1/36]
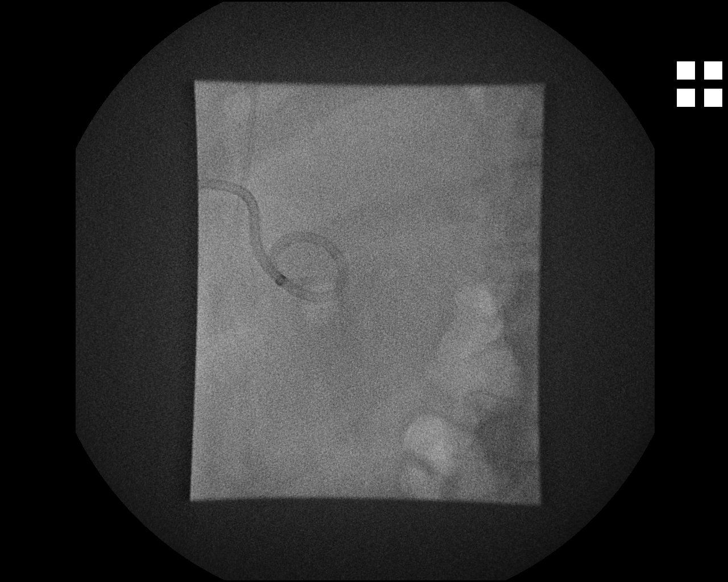
[im 4/36]
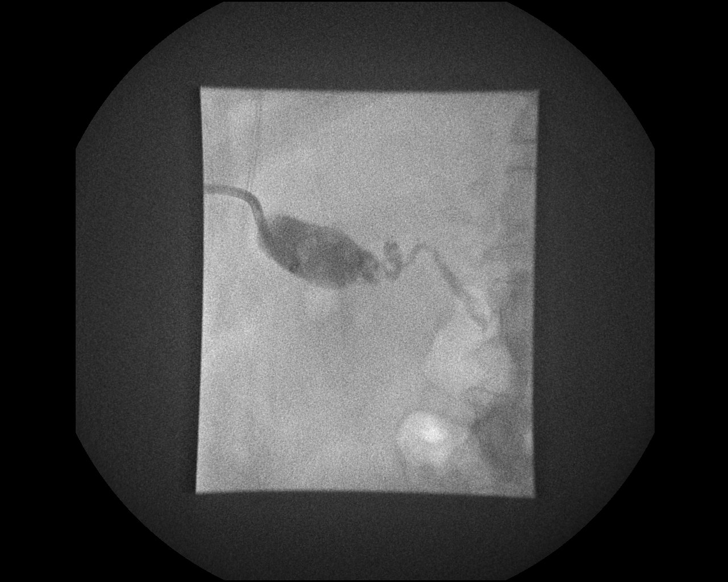
[im 7/36]
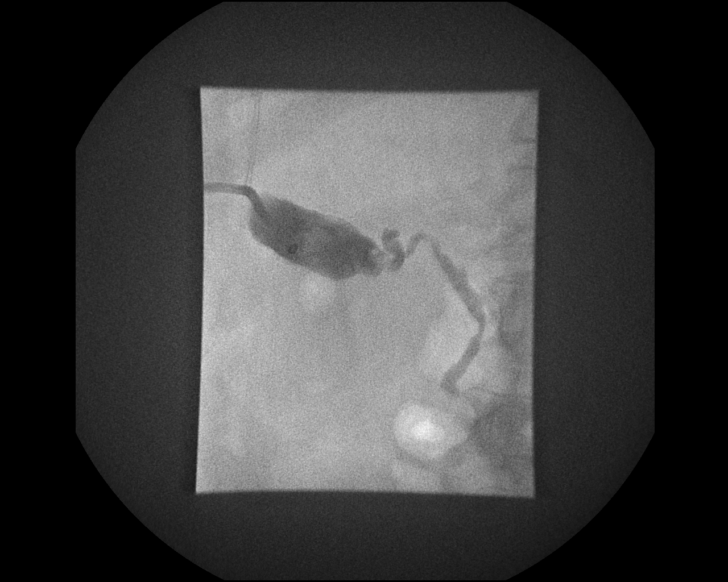
[im 10/36]
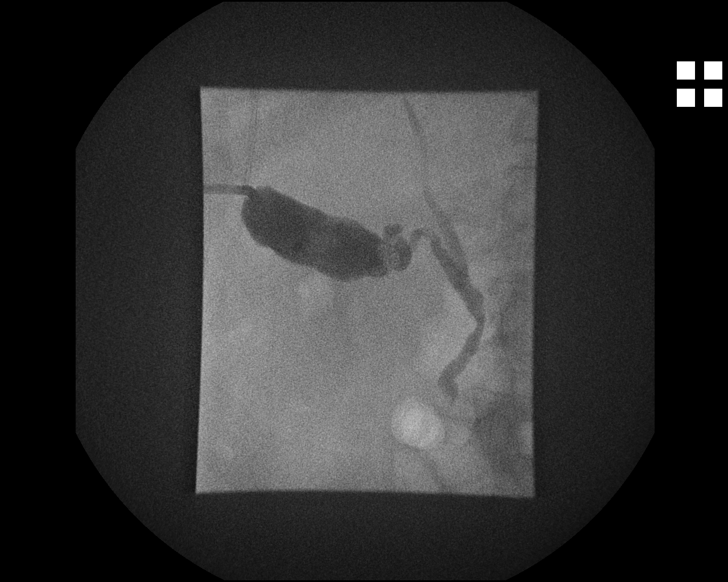
[im 13/36]
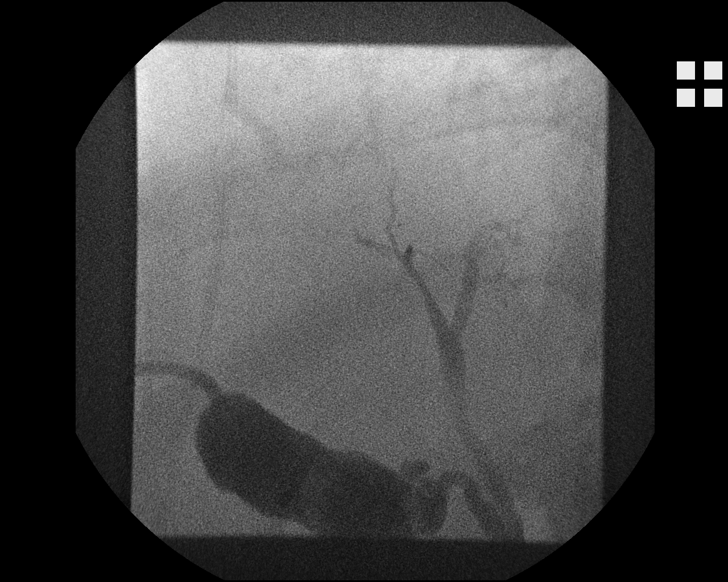
[im 16/36]
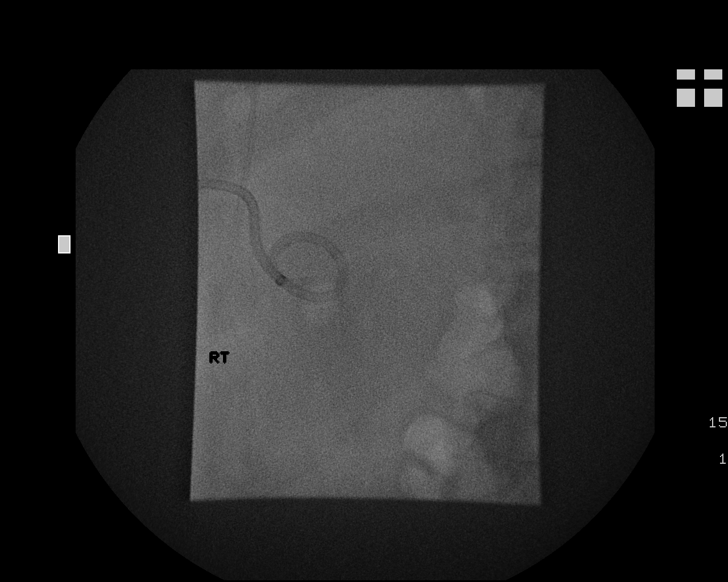
[im 19/36]
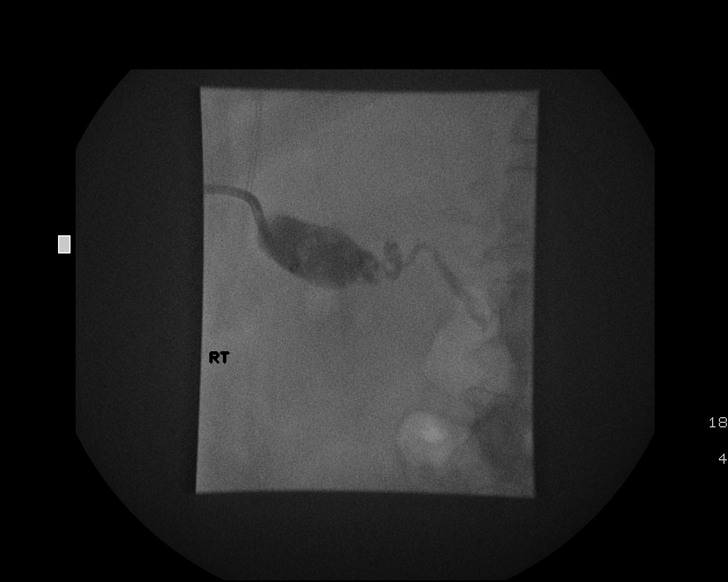
[im 20/36]
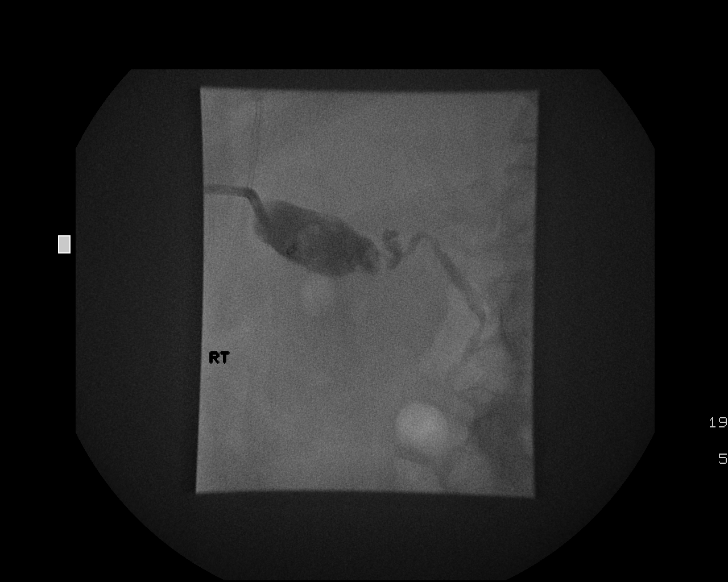
[im 23/36]
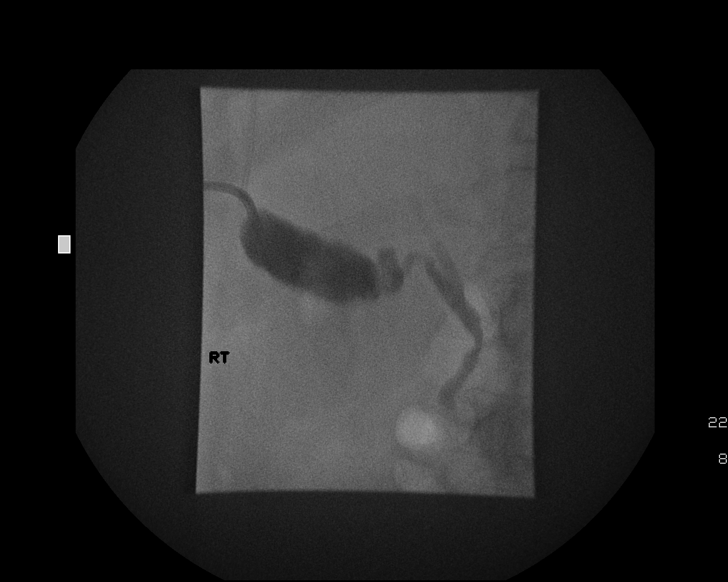
[im 26/36]
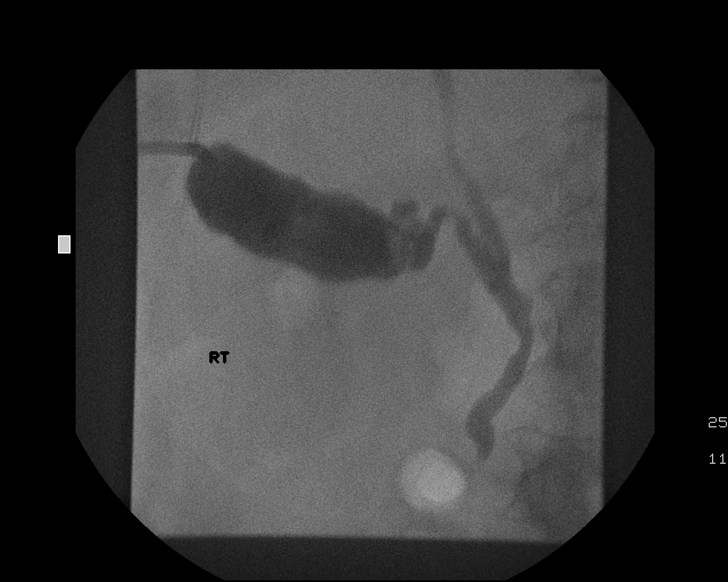
[im 29/36]
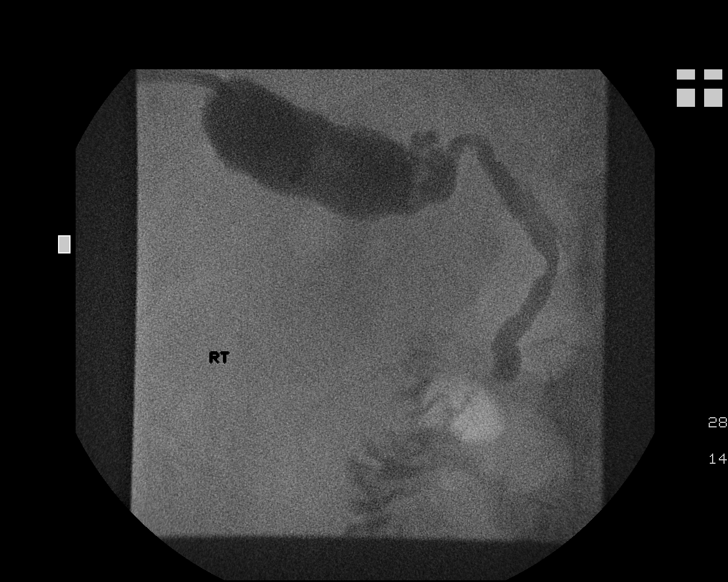
[im 32/36]
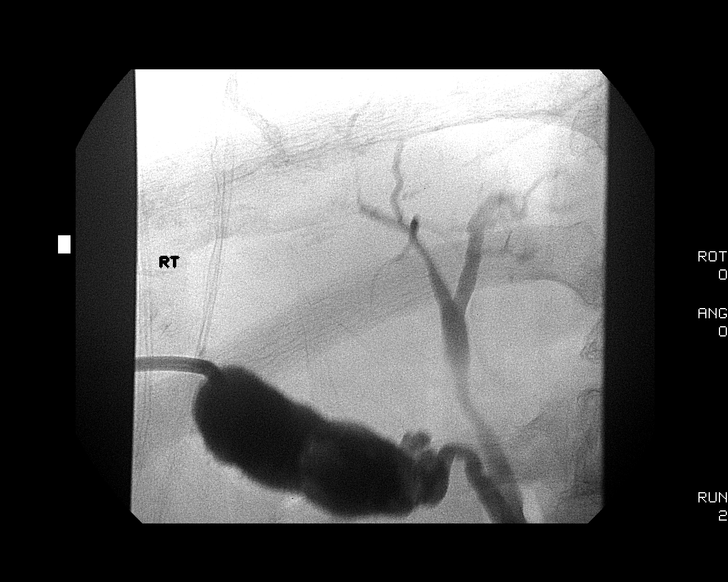
[im 36/36]
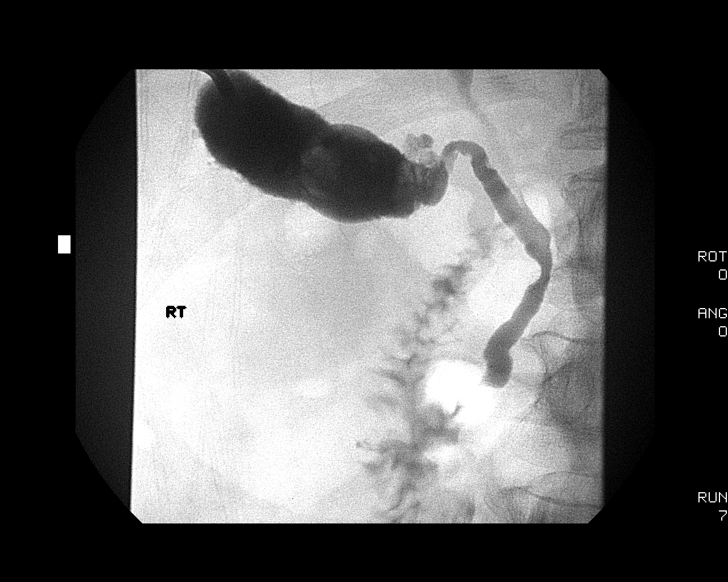

[13 of 24 positions shown; findings below may reference images not displayed]

FINDINGS: The catheter is positioned within the gallbladder.  The
cystic duct is patent.  There is filling of the common bile duct
with minor filling of intrahepatic biliary system.  There is
delayed emptying into the duodenum.  There is poor opacification of
the ampulla.  No definite stones in the common bile duct.
IMPRESSION: Catheter is positioned within the gallbladder.

Cystic duct is patent.

Contrast empties into the duodenum but there appears to be
narrowing at the ampulla. Findings are similar to the cholangiogram
from 12/23/2007. Findings could be related to a stricture but a
lesion cannot be completely excluded.

## 2009-02-06 IMAGING — CR DG CHEST 1V
1 series · 1 of 1 positions shown · non-contrast
Comparison: 12/02/2007

CLINICAL DATA: Altered mental status/weakness

CHEST - 1 VIEW

[view not recorded]
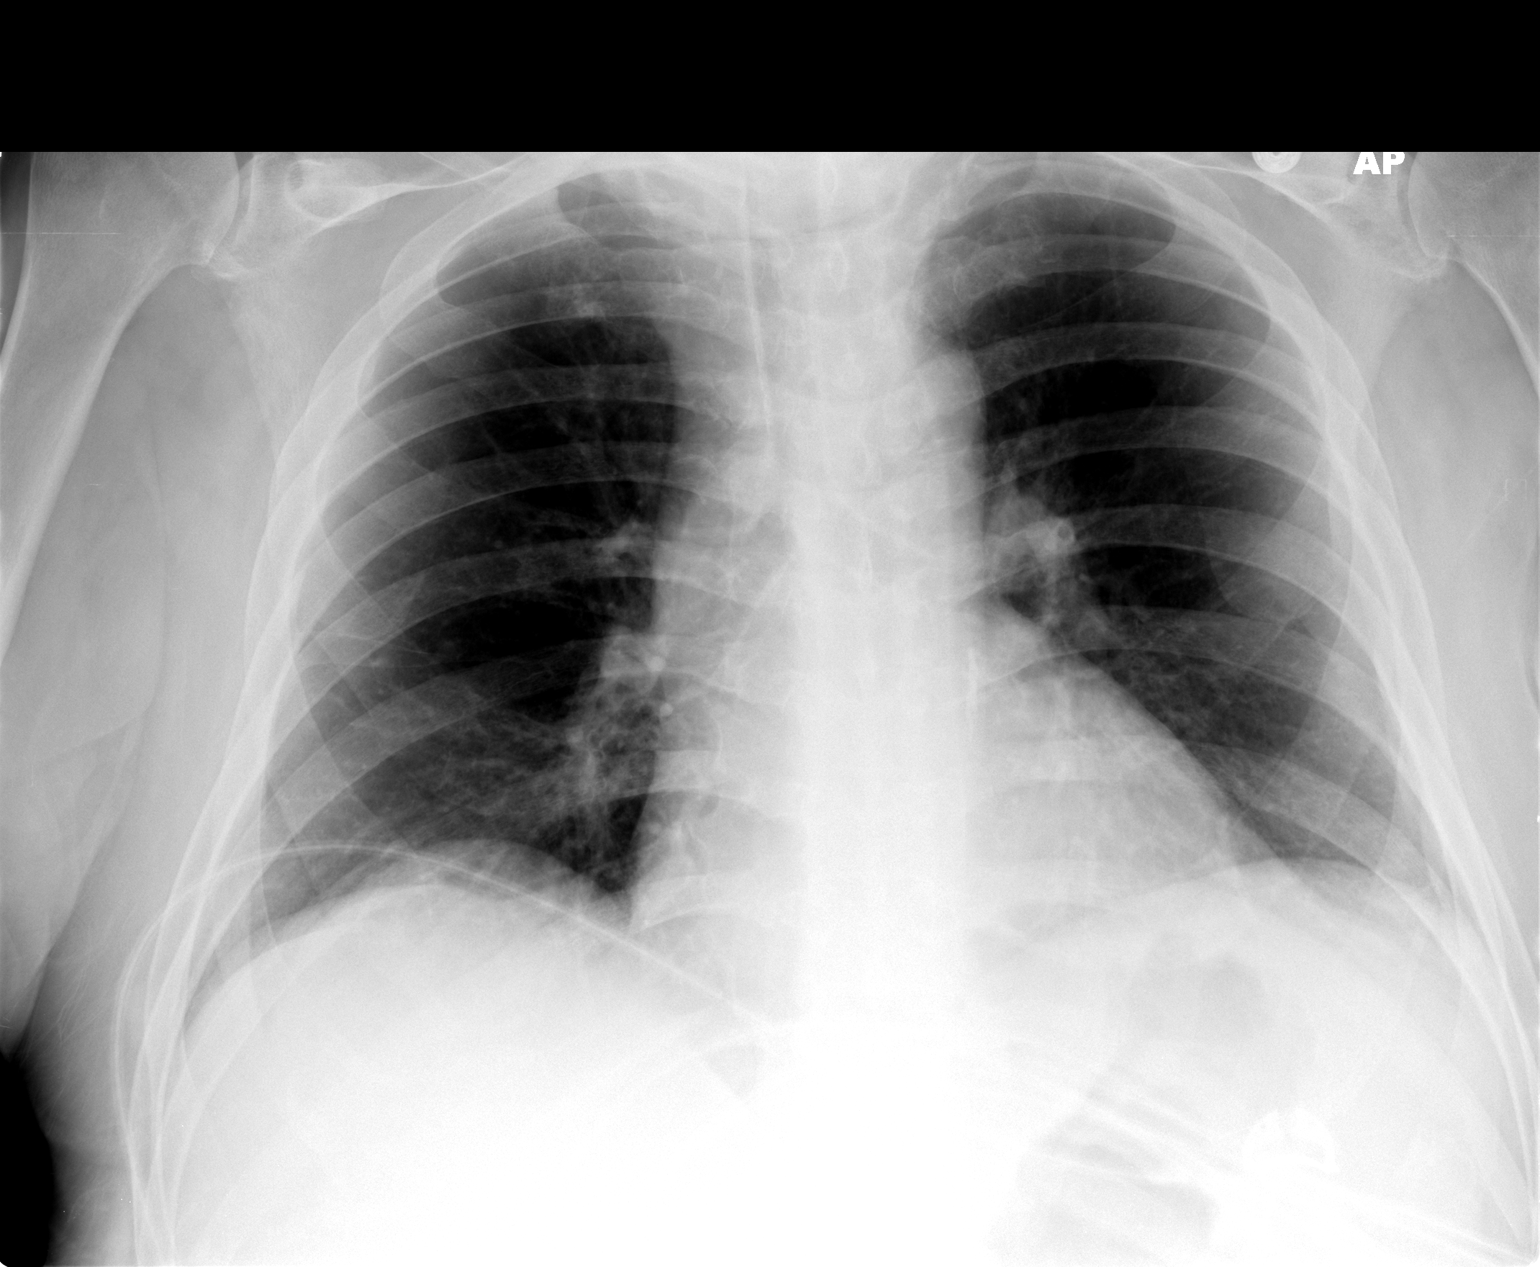

[1 of 1 positions shown; findings below may reference images not displayed]

FINDINGS: Heart size within normal limits considering AP
projection.  There is peribronchial thickening without active
airspace disease or pleural fluid in one-view.
IMPRESSION: No acute or significant findings.

## 2009-02-08 IMAGING — US US ABDOMEN LIMITED
1 series · 8 of 8 positions shown · non-contrast
Comparison: CT abdomen and 01/19/2008.

CLINICAL DATA: Paracentesis.

ABDOMEN ULTRASOUND LIMITED

[Series 1: unknown · 0.33mm/px · 8 of 8 slices shown]
[im 1/8]
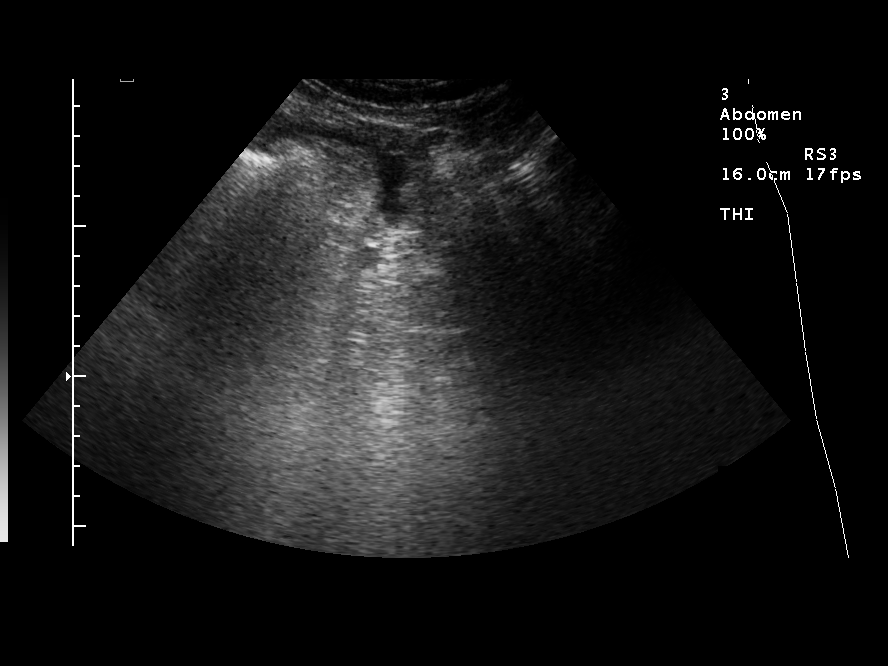
[im 2/8]
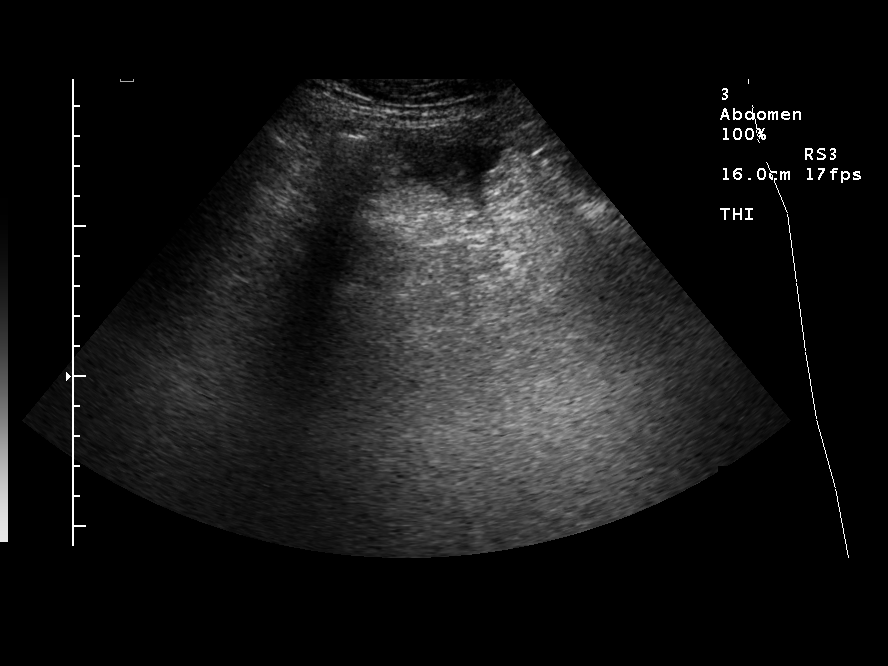
[im 3/8]
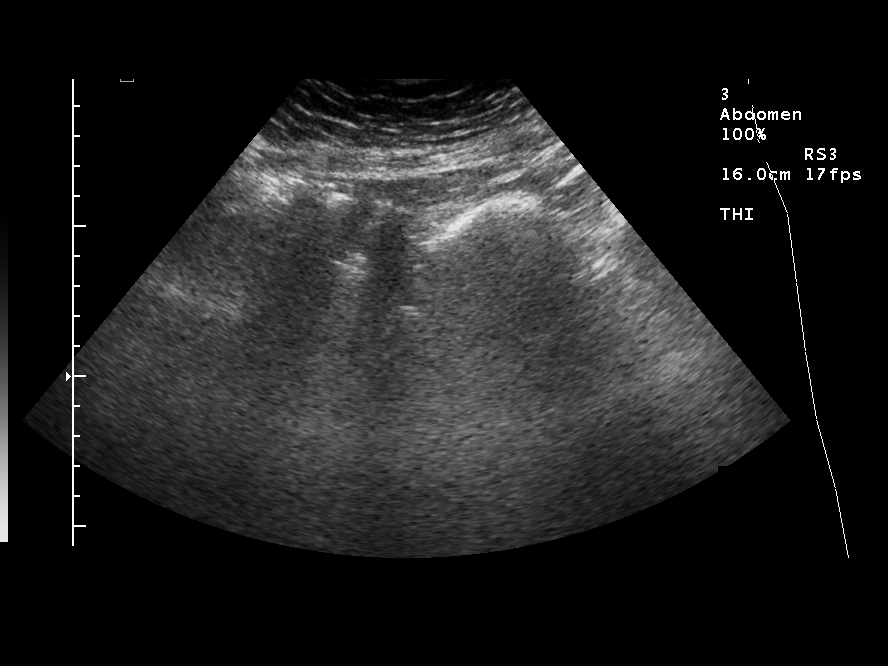
[im 4/8]
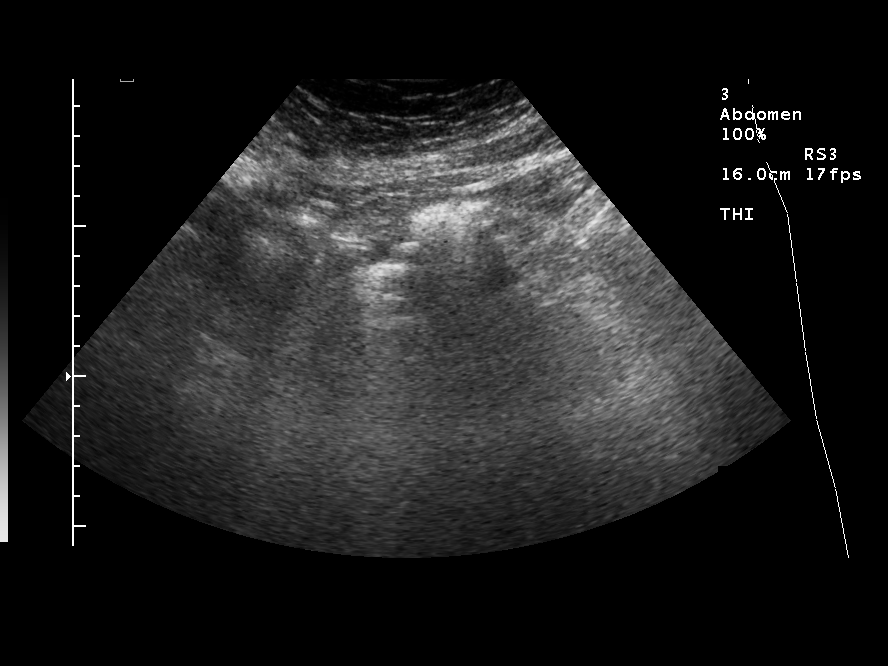
[im 5/8]
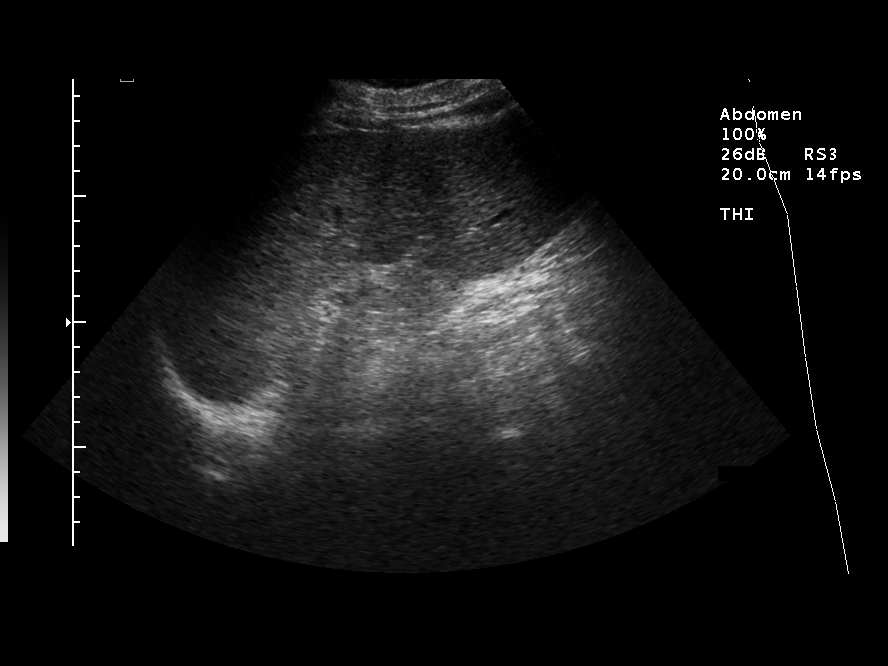
[im 6/8]
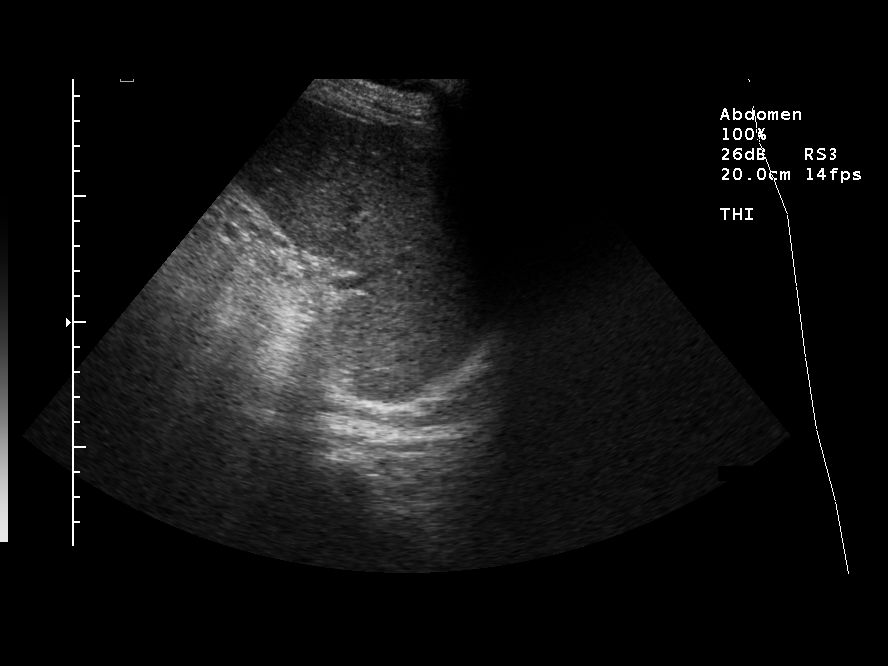
[im 7/8]
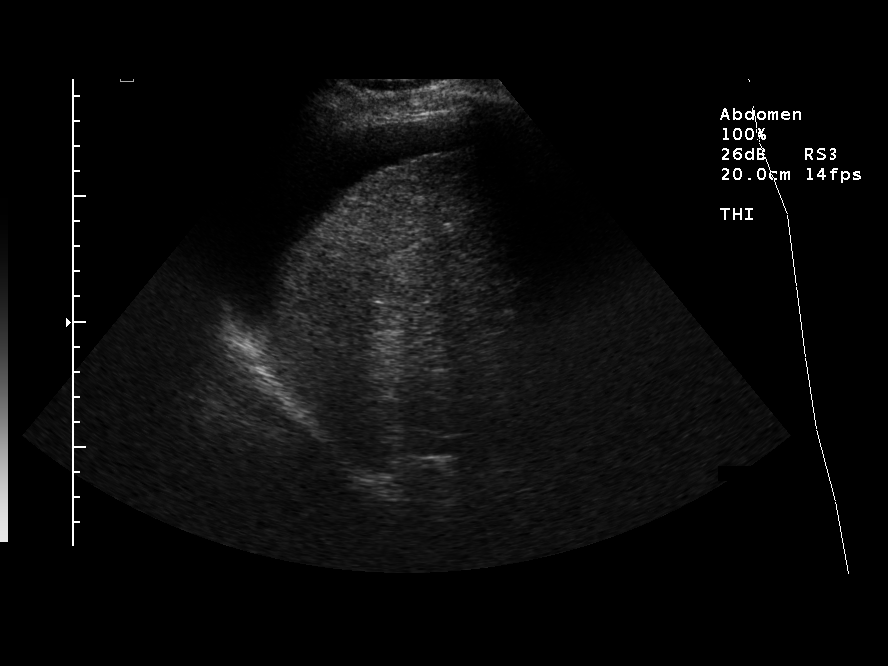
[im 8/8]
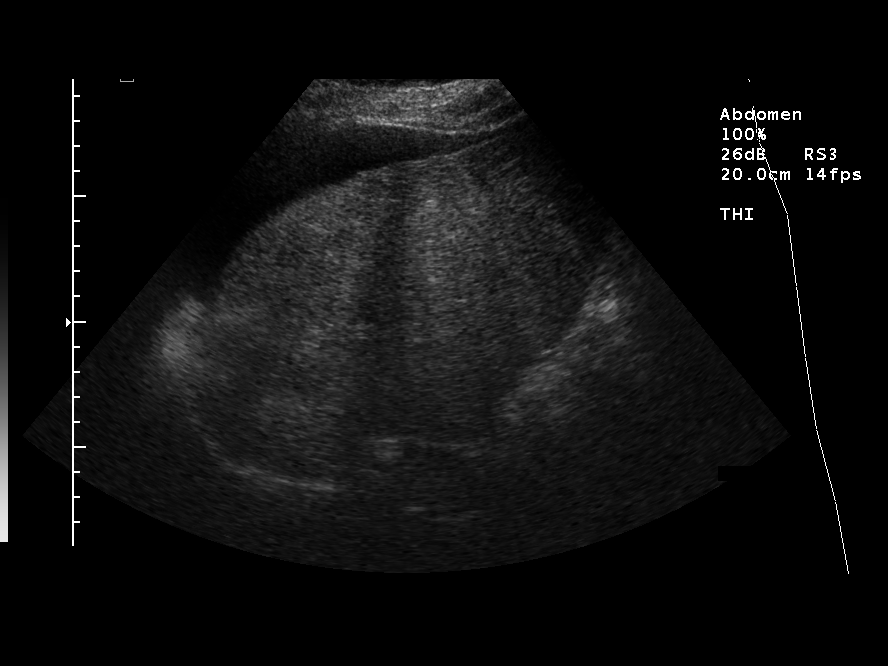

[8 of 8 positions shown; findings below may reference images not displayed]

FINDINGS: There is only a small volume of ascites present.  Liver
appears cirrhotic on this limited examination. Known liver mass is
not well demonstrated on this study.
IMPRESSION: Small volume of ascites in patient with known serosal some liver
mass.

## 2009-02-08 IMAGING — XA IR CHOLANGIOGRAM VIA EXIST CATHETER
1 series · 8 of 8 positions shown · non-contrast
Comparison: 01/10/2008

CLINICAL DATA: 62-year-old male increased output from the
cholecystostomy, originally placed 12/06/2007

CHOLANGIOGRAM VIA EXISTING CATHETER
CHOLECYSTOSTOMY TUBE REMOVAL

[Series 1: run · 8 of 8 slices shown]
[im 1/8]
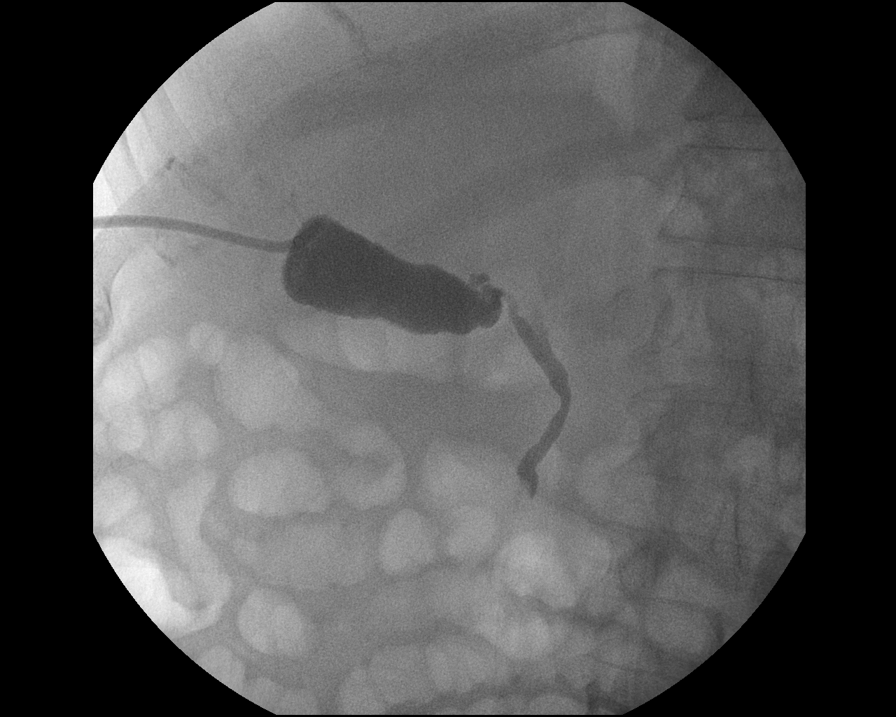
[im 2/8]
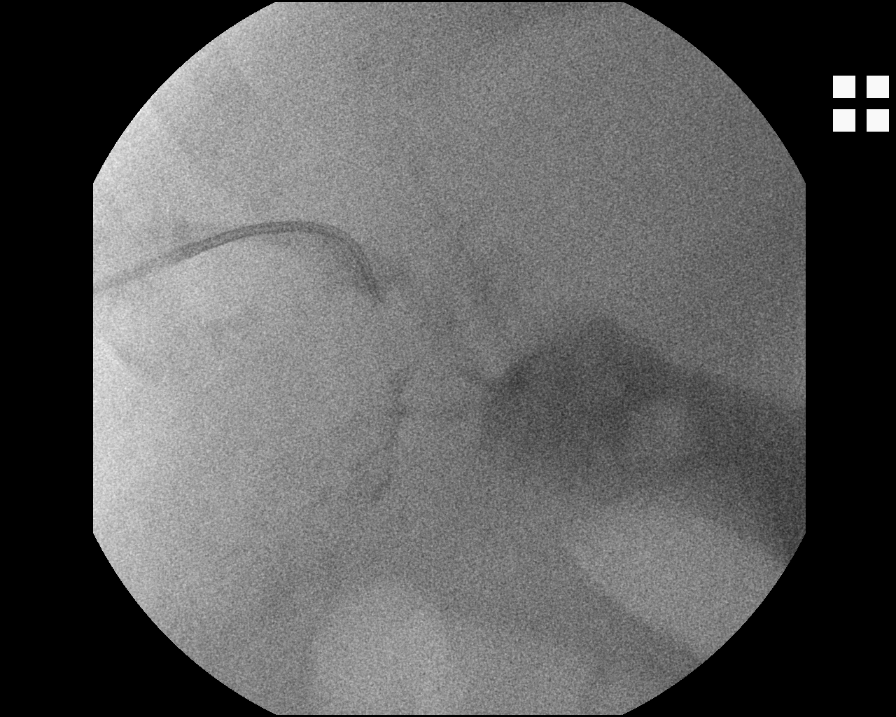
[im 3/8]
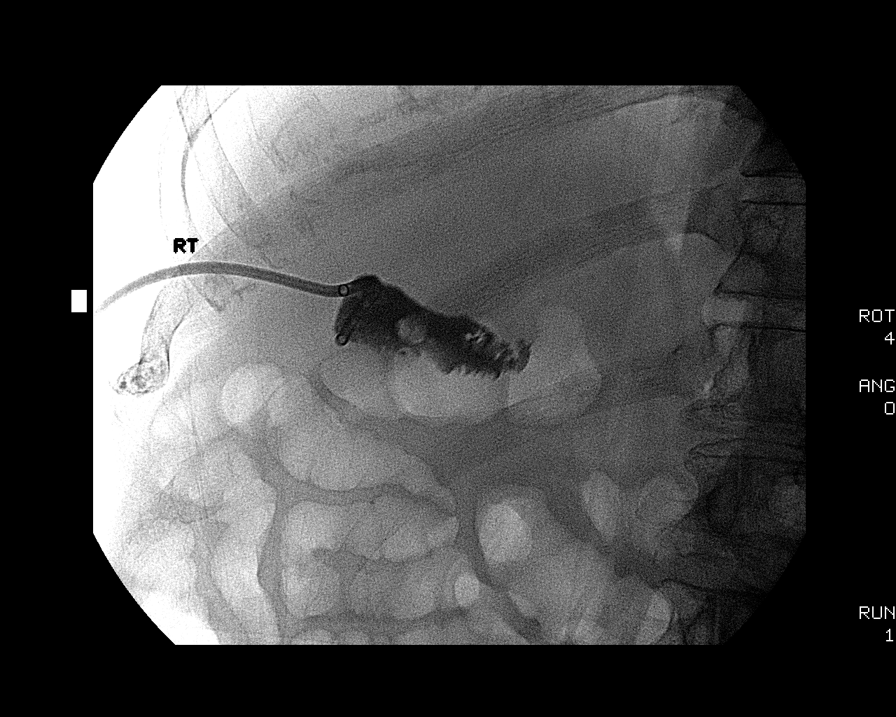
[im 4/8]
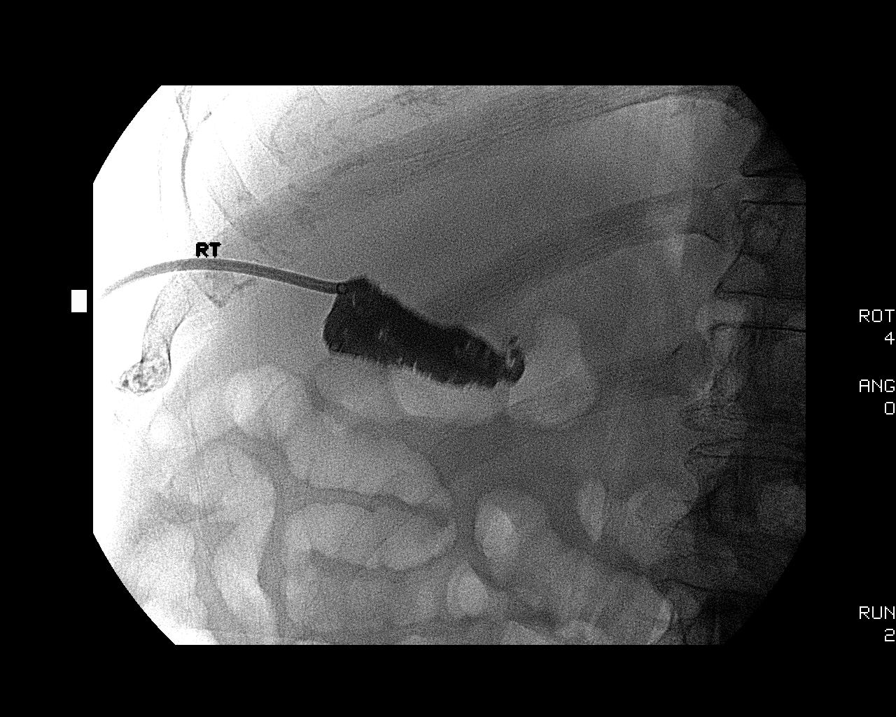
[im 5/8]
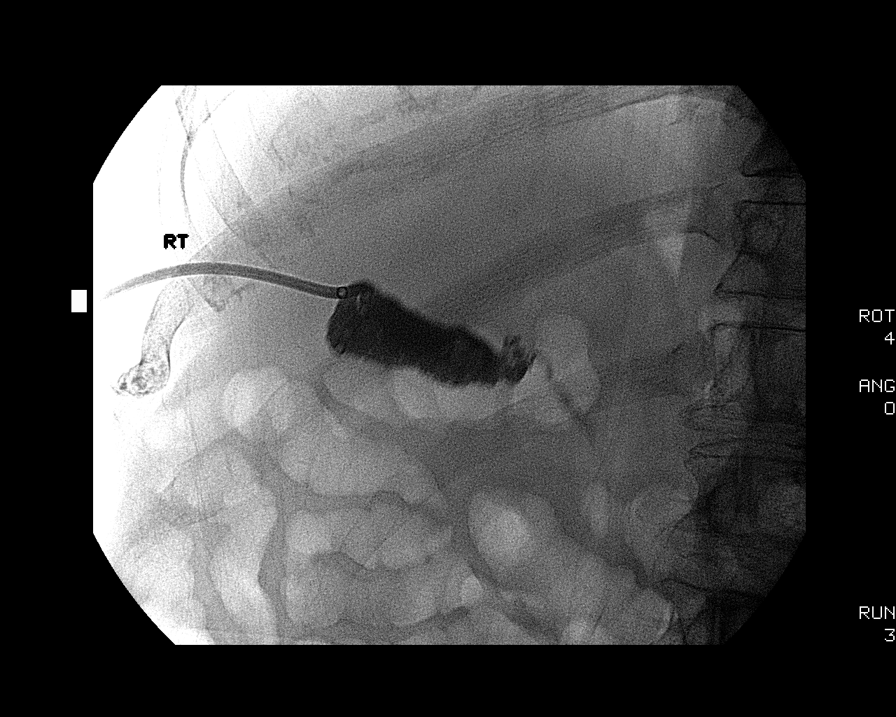
[im 6/8]
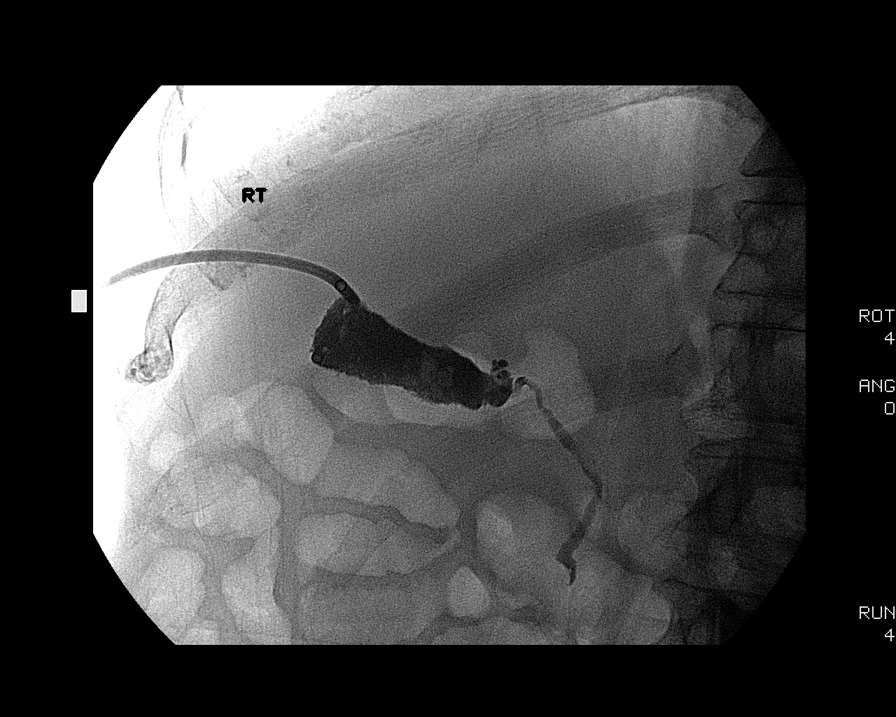
[im 7/8]
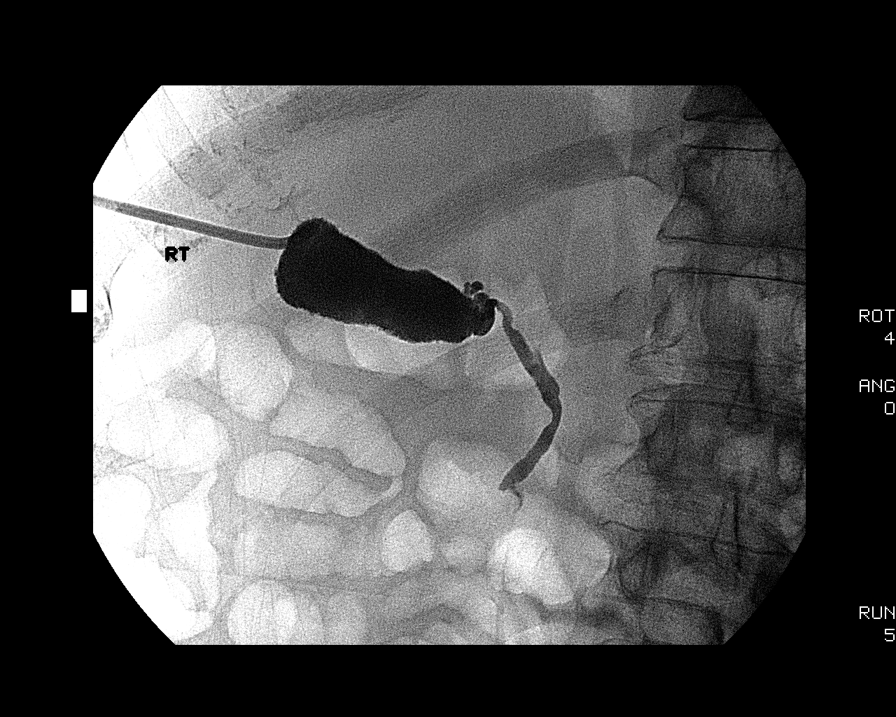
[im 8/8]
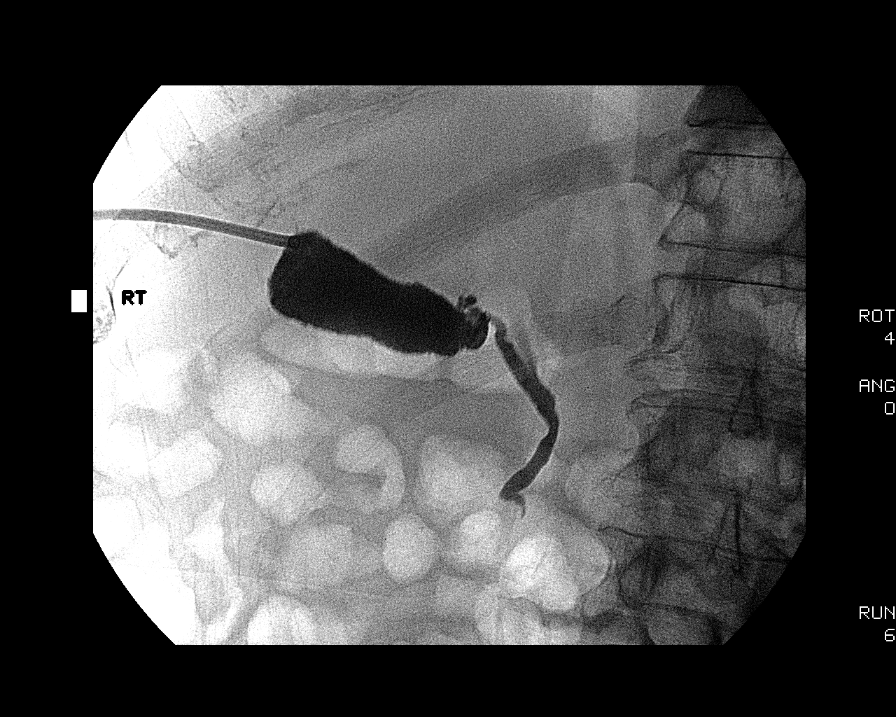

[8 of 8 positions shown; findings below may reference images not displayed]

FINDINGS: Under sterile conditions, the existing cholecystostomy
catheter was injected under fluoroscopy.  Images were obtained.
This demonstrates retraction of the retention loop in the
gallbladder fundus.  The gallbladder lumen opacifies.  The cystic
duct is patent.  The common hepatic duct and common bile duct are
visualized.

The tube was difficult to flush and likely partially occluded with
debris.  Attempts were made to exchange the catheter , however a
guide wire would not pass through the partially occluded tube.

Since the cystic duct is patent and the catheter has been in place
since 12/06/2007, the cholecystostomy was completely removed.
IMPRESSION: Fluoroscopic injection confirms patency of the cystic duct.
Partially occluded cholecystostomy catheter was removed.

Critical test results telephoned to Dr. Benjamon at the time of

## 2010-01-26 ENCOUNTER — Encounter: Payer: Self-pay | Admitting: Thoracic Surgery

## 2010-04-22 LAB — CBC
HCT: 35.7 % — ABNORMAL LOW (ref 39.0–52.0)
HCT: 39.3 % (ref 39.0–52.0)
HCT: 41.9 % (ref 39.0–52.0)
HCT: 43.2 % (ref 39.0–52.0)
Hemoglobin: 11.6 g/dL — ABNORMAL LOW (ref 13.0–17.0)
Hemoglobin: 12.4 g/dL — ABNORMAL LOW (ref 13.0–17.0)
Hemoglobin: 13.4 g/dL (ref 13.0–17.0)
Hemoglobin: 13.7 g/dL (ref 13.0–17.0)
MCHC: 32.8 g/dL (ref 30.0–36.0)
MCHC: 31.8 g/dL (ref 30.0–36.0)
MCHC: 32.7 g/dL (ref 30.0–36.0)
MCV: 88.3 fL (ref 78.0–100.0)
MCV: 89.3 fL (ref 78.0–100.0)
MCV: 90.1 fL (ref 78.0–100.0)
MCV: 90.5 fL (ref 78.0–100.0)
Platelets: 165 10*3/uL (ref 150–400)
Platelets: 103 10*3/uL — ABNORMAL LOW (ref 150–400)
Platelets: 75 10*3/uL — ABNORMAL LOW (ref 150–400)
Platelets: 80 10*3/uL — ABNORMAL LOW (ref 150–400)
RBC: 4 MIL/uL — ABNORMAL LOW (ref 4.22–5.81)
RBC: 4.23 MIL/uL (ref 4.22–5.81)
RBC: 4.37 MIL/uL (ref 4.22–5.81)
RBC: 4.41 MIL/uL (ref 4.22–5.81)
RBC: 4.65 MIL/uL (ref 4.22–5.81)
RDW: 23.4 % — ABNORMAL HIGH (ref 11.5–15.5)
RDW: 22.6 % — ABNORMAL HIGH (ref 11.5–15.5)
RDW: 22.7 % — ABNORMAL HIGH (ref 11.5–15.5)
WBC: 4.8 10*3/uL (ref 4.0–10.5)
WBC: 5.7 10*3/uL (ref 4.0–10.5)
WBC: 4.9 10*3/uL (ref 4.0–10.5)
WBC: 5 10*3/uL (ref 4.0–10.5)
WBC: 6.3 10*3/uL (ref 4.0–10.5)
WBC: 8.7 10*3/uL (ref 4.0–10.5)

## 2010-04-22 LAB — POCT CARDIAC MARKERS
Myoglobin, poc: 38.7 ng/mL (ref 12–200)
Troponin i, poc: <0.05 ng/mL (ref 0.00–0.09)

## 2010-04-22 LAB — URINE CULTURE: Colony Count: NO GROWTH

## 2010-04-22 LAB — COMPREHENSIVE METABOLIC PANEL
ALT: 126 U/L — ABNORMAL HIGH (ref 0–53)
ALT: 145 U/L — ABNORMAL HIGH (ref 0–53)
AST: 119 U/L — ABNORMAL HIGH (ref 0–37)
AST: 133 U/L — ABNORMAL HIGH (ref 0–37)
AST: 110 U/L — ABNORMAL HIGH (ref 0–37)
AST: 112 U/L — ABNORMAL HIGH (ref 0–37)
AST: 112 U/L — ABNORMAL HIGH (ref 0–37)
AST: 122 U/L — ABNORMAL HIGH (ref 0–37)
Albumin: 1.7 g/dL — ABNORMAL LOW (ref 3.5–5.2)
Albumin: 1.8 g/dL — ABNORMAL LOW (ref 3.5–5.2)
Albumin: 1.2 g/dL — ABNORMAL LOW (ref 3.5–5.2)
Albumin: 1.3 g/dL — ABNORMAL LOW (ref 3.5–5.2)
Albumin: 1.3 g/dL — ABNORMAL LOW (ref 3.5–5.2)
Albumin: 1.5 g/dL — ABNORMAL LOW (ref 3.5–5.2)
Alkaline Phosphatase: 99 U/L (ref 39–117)
Alkaline Phosphatase: 143 U/L — ABNORMAL HIGH (ref 39–117)
Alkaline Phosphatase: 92 U/L (ref 39–117)
BUN: 61 mg/dL — ABNORMAL HIGH (ref 6–23)
BUN: 33 mg/dL — ABNORMAL HIGH (ref 6–23)
BUN: 36 mg/dL — ABNORMAL HIGH (ref 6–23)
BUN: 38 mg/dL — ABNORMAL HIGH (ref 6–23)
BUN: 49 mg/dL — ABNORMAL HIGH (ref 6–23)
CO2: 20 mEq/L (ref 19–32)
Calcium: 8.5 mg/dL (ref 8.4–10.5)
Calcium: 7.1 mg/dL — ABNORMAL LOW (ref 8.4–10.5)
Calcium: 7.1 mg/dL — ABNORMAL LOW (ref 8.4–10.5)
Calcium: 7.3 mg/dL — ABNORMAL LOW (ref 8.4–10.5)
Calcium: 7.6 mg/dL — ABNORMAL LOW (ref 8.4–10.5)
Chloride: 106 mEq/L (ref 96–112)
Chloride: 109 mEq/L (ref 96–112)
Chloride: 111 mEq/L (ref 96–112)
Chloride: 112 mEq/L (ref 96–112)
Creatinine, Ser: 3.33 mg/dL — ABNORMAL HIGH (ref 0.4–1.5)
Creatinine, Ser: 1.89 mg/dL — ABNORMAL HIGH (ref 0.4–1.5)
Creatinine, Ser: 2 mg/dL — ABNORMAL HIGH (ref 0.4–1.5)
Creatinine, Ser: 2.08 mg/dL — ABNORMAL HIGH (ref 0.4–1.5)
Creatinine, Ser: 2.29 mg/dL — ABNORMAL HIGH (ref 0.4–1.5)
GFR calc Af Amer: 23 mL/min — ABNORMAL LOW (ref >60)
GFR calc Af Amer: 29 mL/min — ABNORMAL LOW (ref >60)
GFR calc Af Amer: 39 mL/min — ABNORMAL LOW (ref >60)
GFR calc Af Amer: 41 mL/min — ABNORMAL LOW (ref >60)
GFR calc Af Amer: 41 mL/min — ABNORMAL LOW (ref >60)
GFR calc Af Amer: 41 mL/min — ABNORMAL LOW (ref >60)
GFR calc non Af Amer: 32 mL/min — ABNORMAL LOW (ref >60)
Glucose, Bld: 112 mg/dL — ABNORMAL HIGH (ref 70–99)
Glucose, Bld: 142 mg/dL — ABNORMAL HIGH (ref 70–99)
Glucose, Bld: 92 mg/dL (ref 70–99)
Potassium: 5 mEq/L (ref 3.5–5.1)
Potassium: 5 mEq/L (ref 3.5–5.1)
Potassium: 5.1 mEq/L (ref 3.5–5.1)
Sodium: 137 mEq/L (ref 135–145)
Sodium: 132 mEq/L — ABNORMAL LOW (ref 135–145)
Sodium: 135 mEq/L (ref 135–145)
Total Bilirubin: 2.8 mg/dL — ABNORMAL HIGH (ref 0.3–1.2)
Total Bilirubin: 2.4 mg/dL — ABNORMAL HIGH (ref 0.3–1.2)
Total Bilirubin: 2.5 mg/dL — ABNORMAL HIGH (ref 0.3–1.2)
Total Bilirubin: 2.9 mg/dL — ABNORMAL HIGH (ref 0.3–1.2)
Total Protein: 6.4 g/dL (ref 6.0–8.3)
Total Protein: 7.3 g/dL (ref 6.0–8.3)
Total Protein: 5.7 g/dL — ABNORMAL LOW (ref 6.0–8.3)
Total Protein: 5.8 g/dL — ABNORMAL LOW (ref 6.0–8.3)
Total Protein: 6.7 g/dL (ref 6.0–8.3)

## 2010-04-22 LAB — DIFFERENTIAL
Basophils Relative: 0 % (ref 0–1)
Eosinophils Relative: 1 % (ref 0–5)
Lymphocytes Relative: 23 % (ref 12–46)
Lymphs Abs: 1.3 10*3/uL (ref 0.7–4.0)
Monocytes Relative: 5 % (ref 3–12)
Neutro Abs: 4 10*3/uL (ref 1.7–7.7)

## 2010-04-22 LAB — CARDIAC PANEL(CRET KIN+CKTOT+MB+TROPI)
CK, MB: 1.2 ng/mL (ref 0.3–4.0)
Total CK: 43 U/L (ref 7–232)
Total CK: 685 U/L — ABNORMAL HIGH (ref 7–232)

## 2010-04-22 LAB — ACETAMINOPHEN LEVEL: Acetaminophen (Tylenol), Serum: <10 ug/mL — ABNORMAL LOW (ref 10–30)

## 2010-04-22 LAB — RAPID URINE DRUG SCREEN, HOSP PERFORMED
Barbiturates: NOT DETECTED
Cocaine: NOT DETECTED

## 2010-04-22 LAB — APTT
aPTT: 31 seconds (ref 24–37)
aPTT: 32 seconds (ref 24–37)

## 2010-04-22 LAB — CULTURE, BLOOD (ROUTINE X 2): Culture: NO GROWTH

## 2010-04-22 LAB — HEMOCCULT GUIAC POC 1CARD (OFFICE): Fecal Occult Bld: NEGATIVE

## 2010-04-22 LAB — PROTIME-INR
INR: 1.4 (ref 0.00–1.49)
INR: 1.5 (ref 0.00–1.49)
INR: 1.5 (ref 0.00–1.49)
Prothrombin Time: 17.9 seconds — ABNORMAL HIGH (ref 11.6–15.2)
Prothrombin Time: 18.4 seconds — ABNORMAL HIGH (ref 11.6–15.2)

## 2010-04-22 LAB — BASIC METABOLIC PANEL
BUN: 36 mg/dL — ABNORMAL HIGH (ref 6–23)
CO2: 19 mEq/L (ref 19–32)
CO2: 22 mEq/L (ref 19–32)
Calcium: 7.8 mg/dL — ABNORMAL LOW (ref 8.4–10.5)
Chloride: 106 mEq/L (ref 96–112)
GFR calc Af Amer: 26 mL/min — ABNORMAL LOW (ref >60)
GFR calc Af Amer: 34 mL/min — ABNORMAL LOW (ref >60)
GFR calc Af Amer: 41 mL/min — ABNORMAL LOW (ref >60)
GFR calc non Af Amer: 28 mL/min — ABNORMAL LOW (ref >60)
GFR calc non Af Amer: 34 mL/min — ABNORMAL LOW (ref >60)
Potassium: 4.9 mEq/L (ref 3.5–5.1)
Potassium: 5.3 mEq/L — ABNORMAL HIGH (ref 3.5–5.1)
Potassium: 4.8 mEq/L (ref 3.5–5.1)
Sodium: 143 mEq/L (ref 135–145)

## 2010-04-22 LAB — BODY FLUID CULTURE

## 2010-04-22 LAB — TROPONIN I: Troponin I: 0.02 ng/mL (ref 0.00–0.06)

## 2010-04-22 LAB — URINE MICROSCOPIC-ADD ON

## 2010-04-22 LAB — CK TOTAL AND CKMB (NOT AT ARMC)
Relative Index: INVALID (ref 0.0–2.5)
Total CK: 28 U/L (ref 7–232)

## 2010-04-22 LAB — URINALYSIS, ROUTINE W REFLEX MICROSCOPIC
Glucose, UA: NEGATIVE mg/dL
Ketones, ur: 15 mg/dL — AB
Nitrite: NEGATIVE
Specific Gravity, Urine: 1.023 (ref 1.005–1.030)
pH: 5.5 (ref 5.0–8.0)

## 2010-04-22 LAB — GLUCOSE, CAPILLARY

## 2010-04-22 LAB — LIPASE, BLOOD: Lipase: 24 U/L (ref 11–59)

## 2010-05-21 NOTE — H&P (Signed)
NAME:  Stephen Rojas, Stephen Rojas NO.:  [id number]   MEDICAL RECORD NO.:  [id number]          PATIENT TYPE:  INP   LOCATION:  5150                         FACILITY:  [facility]   PHYSICIAN:  Nestor Ramp, MD        DATE OF BIRTH:  1945-04-02   DATE OF ADMISSION:  12/21/2007  DATE OF DISCHARGE:                              HISTORY & PHYSICAL   PRIMARY CARE PHYSICIAN:  Marisue Ivan, MD   CHIEF COMPLAINT:  Nausea, vomiting, diarrhea, and dehydration.   HISTORY OF PRESENT ILLNESS:  This is a 65 year old male with history of  cirrhosis secondary to alcohol abuse and hepatitis C and recent  cholecystostomy drain placed, status post acute cholecystitis and  bacteremia who presents with continued complaint of nausea, vomiting,  and diarrhea.  The patient had cholecystostomy with drain placement on  recent hospitalization beginning of December for acute cholecystitis,  which was deemed inoperable by surgery secondary to thrombocytopenia.  He was discharged with drain in place and p.o. antibiotics to complete a  2-week course and the plan was to have him followup with Interventional  Radiology early January for repeat cholangiogram.  He was seen 5 days  ago by his PCP and was switched from Bactrim p.o. to Cipro p.o. after  reported nausea and vomiting.  He reports no change with this switch.  Today, he was seen at his work for continued vomiting and diarrhea,  which was new in onset of the last couple of days with about 4-8  episodes of watery diarrhea per day.  The patient also reports being  unable to keep anything down including water for more than about 10  minutes.  He also complained of abdominal pain diffusely throughout his  lower abdomen starting in bilateral lower quadrant and radiating to his  right upper quadrant.  This pain is intermittent, but 10/10 in sharp  when it does come.  He states it last for several hours and is improved  with sleep.  Pain is not associated  with food, but any p.o. intake  causes nausea and vomiting.  The patient denies fevers or chills,  endorses emesis which is yellow and nonbilious, nonbloody.  He states he  has not seen any frank blood in the stool, but has noticed blood when he  wipes.  The patient does have history of hemorrhoids.  He also endorses  losing 11-12 pounds since discharge from hospitalization secondary to  being unable to keep anything down.  The patient after being seen at  work in this morning, was sent to short stay for a normal saline bolus  and IV Zofran and some lab work.  The plan was for him to be discharged  if he tolerated p.o. and was feeling better.  After his normal saline  bolus, he is still not feeling well.  So, we are called to admit the  patient.  Also of note, the patient has recently been having about of  400 mL per day output of his drain over the last several days.   ALLERGIES:  1. CODEINE.  2. NSAIDs.   MEDICATIONS:  1. Albuterol 90 mcg inhaler 2 puffs q.4 h. p.r.n. wheezing.  2. Flovent HFA 220 mcg 2 puffs b.i.d.  3. Prilosec OTC 20 mg 2 tablets daily.  4. Propranolol 60 mg one p.o. daily.  5. Lexapro 20 mg one daily.  6. Vitamin B1.  7. Thiamine 100 mg daily.  8. Folic acid 1 mg daily.  9. Lasix 40 mg daily.  10.Lactulose 45 mL by mouth q.i.d. for goal of 3-4 loose stools per      day.  11.Spironolactone 100 mg daily.  12.Lisinopril 20 mg daily.  13.Ciprofloxacin 500 mg one p.o. b.i.d. for two more days to complete      a 14-day course.  14.Zofran 4 mg tablets one t.i.d. p.r.n. nausea prescribed at last PCP      appointment.   PAST MEDICAL HISTORY:  Significant for,  1. Thrombocytopenia chronic with platelets baseline to 40 to 60.  2. Coronary artery disease, status post non-STEMI in March 2009 with      normal Cardiolite 2004, 2006, and a normal stress test in March      2009.  3. Hypertension.  4. Claudication.  5. Venous insufficiency.  6. Alcohol abuse.  7.  Liver cirrhosis with portal hypertension.  8. Hepatitis C.  9. Gastric and antral ulcers which were H. pylori negative in March      2008.  10.History of descending colon adenomas x5 with high-grade dysplasia      in 2008.  11.Small internal and external hemorrhoids.  12.History of GI bleed from his gastric ulcers in 2004.  13.History of COPD.  14.Tobacco abuse, quit in 2005.   PAST SURGICAL HISTORY:  Cholecystostomy in December 2009 with biliary  stent placed.   FAMILY HISTORY:  Significant for brother who died of an MI at age 77,  blood pressure elevated runs in the family.  Father with cirrhosis from  alcoholism who died at 46 and mother who passed away at 44 year old from  an unsure cause who had had history of TB.   SOCIAL HISTORY:  The patient lives in [place] and has a supportive  daughter here as well.  He is single.  He is on disability.  The patient  has history of IV drug use specifically heroin, but he quit greater than  10 years ago, history of significant smoking history with greater than  50 pack years, but quit in 2005 and history of alcohol abuse but has  been sober since March 2009 last admission.   PHYSICAL EXAMINATION:  VITAL SIGNS:  Temperature 98.1, heart rate 73,  blood pressure 130s/70s, O2 sat 90s in room air, weight is 211 pounds.  GENERAL:  Obese male with cholecystostomy drain in place, drained  bilious material, no acute distress, nontoxic.  HEENT:  Normocephalic, atraumatic.  Mild conjunctival injection and  icterus.  Moist mucous membranes  after 1 liter of normal saline.  No  pharyngeal erythema or edema.  Pupils are equally round and reactive to  light.  Extraocular movements intact.  No lymphadenopathy noted.  LUNGS:  Clear to auscultation bilaterally.  No crackles noted.  HEART:  Normal S1, S2.  No murmurs, rubs, or gallops noted.  ABDOMEN:  Acidic and distended.  Positive bowel sounds, hyperactive, but  soft.  There is an evident fluid wave  on exam.  Drain is intact without  erythema or drainage around it.  Dressings are clean, dry, and intact.  Drain is draining brown-green fluid.  Abdomen  is diffusely tender to  deep palpation, worse around the drain, nontender to light palpation.  No guarding or rebound.  MUSCULOSKELETAL:  No deformities noted.  Pulses 2+ radial pulses  bilaterally.  EXTREMITIES:  No clubbing, cyanosis, or edema.  SKIN:  No rash or jaundice noted.   LABS:  White blood cell 10.1, hemoglobin 12.4, platelets 175.  Sodium  133, potassium 5.2, chloride 109, bicarb 15, BUN 48, creatinine 2.43,  glucose 75, total bili is 2.0, ALT 105, AST 101, ALT 119, total protein  8.7, albumin 3.0, calcium 9.3, lipase 132.  KUB pending.   ASSESSMENT AND PLAN:  This is a 65 year old male with history of  cirrhosis and blood pressure who was recently admitted for acute  cholecystitis and cholelithiasis, status post percutaneous biliary drain  who presents with continued nausea, vomiting, and diarrhea.  Of note,  the patient also had bacteremia with Klebsiella and Escherichia coli on  last hospitalization.  1. Vomiting, likely viral gastroenteritis versus Clostridium difficile      versus worsening of intra-abdominal process.  Check KUB, still      pending.  Evidently dehydrated.  We will continue to rehydrate      overnight with a 100 mL per hour normal saline.  The patient status      post 1 liter normal saline in short stay.  Continue gentle      rehydration in this end-stage liver disease.  The patient reassess      with status in the morning.  Continue IV Zofran and add Phenergan      for nausea.  For abdominal pain, there is no evidence of acute      worsening of cholecystitis or repeat infection as the patient is      afebrile and has normal white blood count, but we will continue to      watch.  Lipase is slightly elevated, but not high enough to make me      think of pancreatitis, although he does have risk factors  of      cholelithiasis.  Latest cholangiogram showing patent biliary bile      duct.  Consider rechecking lipase in the morning.  His low bicarb      is likely from the diarrhea, slightly metabolic acidosis.  2. Diarrhea, viral gastroenteritis for Clostridium difficile.  History      of being on Cipro, Bactrim, as well as IV antibiotics in the      hospital.  We will check Clostridium difficile x3 and place on      contact precautions.  We will check Hemoccult given history of      blood work wiping, although history of hemorrhoids in the patient.      I doubt there is a bleed as hemoglobin is stable.  Monitor with      CBC.  3. History of acute cholecystitis.  He is to completed his antibiotic      course of 14 days with 2 more days of IV ciprofloxacin.  If      continued nausea, vomiting, and diarrhea, consult surgeries to      reevaluate candidacy for operative procedure.  On last admission,      he was in a candidate secondary to thrombocytopenia, but not been      improved.  ALT within normal limits.  LFTs elevated, but stable      since discharge.  Total bili is also elevated, continue to monitor  with CMP in the morning.  4. Alcoholic cirrhosis of the liver, stable and U and C evaluation for      transplant, no longer drinks.  5. Chronic obstructive pulmonary disease.  Continue albuterol and      Flovent, stable for now oxygen supplement with acute sats greater      than 93%.  This is an active problem this hospitalization.  6. Portal hypertension, gentle rehydration.  We are holding Lasix and      spironolactone as he seems dehydrated.  7. Chronic thrombocytopenia, platelets today 170.  Check CBC tomorrow.  8. Hypertension.  Holding Lasix, spironolactone, and lisinopril for      now.  9. Acute renal failure, creatinine 2.4 this admission.  On discharge,      he was 0.8.  No history of renal insufficiency likely secondary to      dehydration and prerenal.  Check urinalysis  regardless and monitor      with BMP in the morning.  Hopeful for improvement with IV fluids      overnight.  10.Depression.  Continue Lexapro.  11.Fluids, electrolytes, nutrition/gastrointestinal.  N.p.o. for now.      Check KUB to rule out obstruction, hyperkalemia to 5.2.  Monitor      for now.  Check BMP in the morning.  Sodium level 133.  Also, but      status post 1 liter of normal saline.  Continue saline and      rehydration, and recheck in the morning.  12.Prophylaxis.  Protonix or to proton pump inhibitor, subacute      bacterial endocarditis for deep venous thrombosis prophylaxis as      history of thrombocytopenia/anemia.  13.Disposition.  Pending clinical improvement and tolerance of p.o.      food.  Admit to regular floor and rehydrate overnight.  Consider      surgery consult if he stays with abdominal pain, nausea, vomiting,      diarrhea to conservative treatment.      Eustaquio Boyden, MD  Electronically Signed      Nestor Ramp, MD  Electronically Signed    JG/MEDQ  D:  12/21/2007  T:  12/22/2007  Job:  [id number]

## 2010-05-21 NOTE — Consult Note (Signed)
NAME:  Stephen Rojas, Stephen Rojas NO.:  [id number]   MEDICAL RECORD NO.:  [id number]           PATIENT TYPE:   LOCATION:                                 FACILITY:   PHYSICIAN:  James L. Malon Kindle., M.D.DATE OF BIRTH:  [birth year]   DATE OF CONSULTATION:  12/22/2007  DATE OF DISCHARGE:                                 CONSULTATION   REQUESTING PHYSICIAN:  Pearlean Brownie, MD in the family practice  service.   HISTORY:  Mr. Ozment is a 65 year old gentleman who was just discharged  from the hospital on December 10, 2007.  He was found to have acute  cholecystitis, manifested with right upper quadrant pain, etc.  An  ultrasound showed gallstones with a normal common bile duct.  He had a  positive Murphy sign and multiple gallstones and sludge in the  gallbladder.  He has known cirrhosis with splenomegaly due to alcohol  use and hepatitis C and chronically, he has low platelets.  Hepatobiliary scan revealed nonvisualization of the gallbladder with  delayed excretion of material by the liver that was felt to be  consistent with acute cholecystitis.  Due to the patient's known  cirrhosis and low platelets, he was not felt to be a surgical candidate  and was treated by placement of a cholecystostomy tube with plans to  repeated a cholangiogram in approximately 6 weeks, in early January.  Cholangiogram was obtained on December 09, 2007, prior to his discharge,  and did show the gallstones with contrast entering the cystic duct which  was patent down in the common bile duct which was patent with contrast  entering the duodenum and no signs of choledocholithiasis.  The patient  was also found to be septic with Klebsiella and E. coli and was  discharged home on Bactrim as well as lactulose.  He notes after going  home, he continued to have vague pain in the right upper quadrant,  apparently tolerated his antibiotics poorly.  He has had diarrhea ever  since he left the hospital, has  been taking the lactulose in an unclear  doses.  He reports that he has had diarrhea up to 10 stools a day ever  since leaving the hospital.  He was seen in the family practice center  and apparently due to persistent nausea and vomiting and inability to  eat, his antibiotics were changed to Cipro from Bactrim and this really  did not help.  He continued to have diarrhea and vomiting.  He was  unable to eat.  The other thing he felt he was getting dehydrated.  He  states that he has had dissipate pain that is nonlocalizing and does not  seem to be any better or worse, and it also appears that the diarrhea  has been about the same.  He had not had any fever or chills.  He has  noted no bleeding or pus coming out of his cholecystostomy tube.  He was  readmitted for rehydration, antibiotics, and further treatment.  We are  asked to see him regarding his liver disease and his nausea,  vomiting,  and diarrhea.   MEDICATIONS ON ADMISSION:  Albuterol, Flovent, Prilosec, propranolol,  Lexapro, B12, thiamine, folic acid, Lasix, lactulose 45 mL q.i.d.,  spironolactone, lisinopril, Cipro 500 p.o. b.i.d., and Zofran.   He allergic to CODEINE and intolerant to NONSTEROIDAL DRUGS.   MEDICAL HISTORY:  1. Cirrhosis on the basis of a long history of alcohol abuse and      hepatitis C.  The patient has never been treated for hepatitis C.      He has secondary thrombocytopenia with platelet counts running from      40-60.  It is felt to be due to his cirrhosis.  He is being      considered to be evaluated in [facility] for possible listing on      the transplantation list.  2. Hypertension.  3. Coronary disease status post MI with a subsequent normal stress      test.  4. History of antral ulcers.  5. History of colonic adenomas in 2008, one with high-grade dysplasia,      due for followup colonoscopy.  6. Cigarette abuse and probable COPD.   PAST SURGERIES:  Cholecystostomy tube placement in  December of this  year.   FAMILY HISTORY:  He had a brother that died in his 30s from heart  attack.  There is cirrhosis in the family from alcoholism and family  history of TB.   SOCIAL HISTORY:  The patient has a long history of alcohol abuse, has  had previous tattoos, previously used IV drugs.  He reportedly has had  nothing to drink now for 9 months.  He is single, has a daughter here in  town.  He is on disability.   PHYSICAL EXAMINATION:  VITAL SIGNS:  Temperature 97.5, pulse 90, blood  pressure 134/74.  GENERAL:  Slightly icteric male who seems somewhat pale.  LUNGS:  Clear.  HEART:  Regular rate and rhythm.  No murmurs or gallops.  ABDOMEN:  Distended with a cholecystostomy tube bandage going into the  right upper quadrant draining a greenish-yellowish fluid that does not  look infected.  He does have diffuse tenderness but does have bowel  sounds.   PERTINENT LABORATORY DATA:  White count 5.7, hemoglobin 10.1, platelet  count is 113, down slightly from 175 when he came in.  Alkaline  phosphatase normal.  SGOT, SGPT approximately 2 times upper limit  of  normal.  Albumin 2.6, total bilirubin 2.3.  Ammonia level 21.  Lipase  132.  Urinalysis remarkable for a lot of epithelial cells and a few  increased white blood cells.   ASSESSMENT:  1. Nausea and vomiting with elevated amylase and lipase:  It is      possible that he could have pancreatitis.  He could have passed      some stone material.  This seems be related to the Cipro, and he      may just be intolerant to that.  2. Diarrhea:  I doubt this is Clostridium difficile.  We do need to      certainly check for that, but it is more than likely related to the      lactulose which he takes.  3. Chronic abdominal pain, possibly due to multitude of causes:  He      does have gallstones.  At some point, this will need to be dealt      with.  4. Cirrhosis of the liver, secondary alcohol abuse and chronic  hepatitis C:   When better, he may well need to be considered to be      listed for liver transplant.  5. Thrombocytopenia:  Chronic and probably related to cirrhosis.  6. Probable chronic obstructive pulmonary disease.   RECOMMENDATIONS:  We will go ahead and culture his bowel and keep him on  antibiotics now, await for the C. diff toxin, continue him on Protonix.  We may well need to do a cholangiogram just to make sure that the  gallbladder is patent, and there are no common duct stones.           ______________________________  Llana Aliment Malon Kindle., M.D.     Waldron Session  D:  12/22/2007  T:  12/23/2007  Job:  [id number]   cc:   Pearlean Brownie, M.D.
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
## 2010-05-21 NOTE — H&P (Signed)
NAME:  Stephen Rojas, Stephen Rojas NO.:  [id number]   MEDICAL RECORD NO.:  [id number]          PATIENT TYPE:  OBV   LOCATION:  5524                         FACILITY:  [facility]   PHYSICIAN:  Wayne A. Sheffield Slider, M.D.    DATE OF BIRTH:  Mar 19, 1945   DATE OF ADMISSION:  12/01/2007  DATE OF DISCHARGE:                              HISTORY & PHYSICAL   PRIMARY CARE PHYSICIAN:  Marisue Ivan, MD, at the [facility].   CHIEF COMPLAINT:  Chest pain.   HISTORY OF PRESENT ILLNESS:  This is a 65 year old male with complaint  of chest pain x1 week that worsened today.    DICTATION ENDED AT THIS POINT.      Angeline Slim, MD       Wayne A. Sheffield Slider, M.D.     CT/MEDQ  D:  12/02/2007  T:  12/02/2007  Job:  [id number]

## 2010-05-21 NOTE — Consult Note (Signed)
NAME:  AHAAN, ZOBRIST NO.:  [id number]   MEDICAL RECORD NO.:  [id number]          PATIENT TYPE:  INP   LOCATION:  2626                         FACILITY:  [facility]   PHYSICIAN:  Rollene Rotunda, MD, FACCDATE OF BIRTH:  Jul 12, 1945   DATE OF CONSULTATION:  04/09/2007  DATE OF DISCHARGE:                                 CONSULTATION   REASON FOR CONSULTATION:  Evaluate patient with elevated troponin.   HISTORY OF PRESENT ILLNESS:  The patient is a 65 year old gentleman.  We  have seen him in the past for evaluation of dyspnea felt to be possibly  of a noncardiac etiology.  He has had normal EF in the past.  BNP levels  have been normal.  He has had stress perfusion studies at least 3 times  in the past.  The most recent was in 2007 apparently.  This demonstrated  an EF of 50% with no evidence of ischemia or infarct.   He was admitted on March 31 with altered mental status, weakness.  All  this was felt to be possibly secondary to hepatic encephalopathy.  He  has a history of cirrhosis.  He was not complaining of any new dyspnea.  Apparently he had some chest discomfort, though this was vaguely  described by the patient.  Initially his enzymes were negative.  He has  no EKG changes.   Last night he was quite agitated.  He eventually had an episode at 3:15  a.m. of stridor.  He had sinus tachycardia.  He required treatment with  albuterol, Atrovent and BiPAP.  Again EKG demonstrated no acute ST-  segment changes.  Chest x-ray demonstrated low lung volumes, but no  acute findings.  Eventually, had improvement in his oxygenation and  agitation.  However, this morning his troponins were slightly elevated  at 0.24.  He again has not had any acute ST-segment changes or new Q-  waves.   Currently the patient is sedated.  I can barely arouse him.  He seems to  mumble, but he is not having chest pain.  However, I cannot get any more  out of him.   PAST MEDICAL HISTORY:   Hypertension, cirrhosis, hepatitis C, ongoing  EtOH abuse, portal hypertension, thrombocytopenia, colonic polyps  resected, chronic venous insufficiency and lower extremity edema.   PAST SURGICAL HISTORY:  Right knee surgery, right knee replacement,  tonsillectomy, no jaw surgery.   ALLERGIES:  CODEINE.   MEDICATIONS:  (At admission), multivitamin, inhaler, vitamin B1,  spironolactone 25 mg daily, Lasix 40 mg daily, enalapril 20 mg daily,  propranolol 60 mg every other day.   SOCIAL HISTORY:  The patient lives alone.  He is not married.  Has a  supportive daughter.  He drinks 6 to 12 beers a day.  He quit tobacco in  2005.   FAMILY HISTORY:  Contributory apparently for a brother dying of  myocardial infarction at age 35.   REVIEW OF SYSTEMS:  Unobtainable from the patient.   PHYSICAL EXAMINATION:  The patient is somnolent and barely arousable but  in no distress.  The blood  pressure 112/60 for, heart rate 74 and regular, 92% saturation  on 2 liters.  HEENT:  Eyelids unremarkable.  Pupils are equal, round, and reactive to  light and accommodation.  Fundi are not visualized.  Unable to visualize  oral mucosa.  NECK:  No apparent jugular venous distension, wave form appears normal.  Carotid upstroke brisk and symmetric, no bruits.  LYMPHATICS:  No cervical, axillary, or inguinal adenopathy.  LUNGS:  Decreased breath sounds, but no wheezing or crackles.  BACK:  Unable to assess any costovertebral angle tenderness.  HEART:  PMI not displaced or sustained, S1 and S2 within normal limits,  no S3, no S4, no clicks, rubs, murmurs.  ABDOMEN:  Obese, positive bowel sounds, normal in frequency and pitch,  no bruits, rebound, guarding.  No midline pulsatile mass, hepatomegaly,  splenomegaly.  SKIN:  No rashes, no nodules.  Multiple tattoos.  Chronic lower  extremity venous stasis changes.  NEURO:  Unable to assess cranial nerves or sensory nerves.  He is moving  all of his extremities.   He is somnolent, having had sedation.   EKG (this a.m.) sinus rhythm, premature ventricular contraction, no  acute ST-T wave changes.   Labs, cardiac panel CK 93, MB 1, troponin 0.24, sodium 38, potassium  3.3, chloride 101, BUN 9, creatinine 1.1, WBC 41, hemoglobin 14.3,  platelets 68,000, TSH 2.515.   ASSESSMENT/PLAN:  1. Elevated troponin.  The patient seems to have had a troponin      elevation secondary to his acute respiratory event rather than the      event being primarily a cardiac issue.  He had no ischemic or      injurious ST-segment changes.  He has no CK-MB bump.  This is      probably a type 2 myocardial infarction related to supply demand      mismatch and stress.  Will check an echocardiogram.  Once he has      recovered neurologically, he should get another stress perfusion      study for before being discharged.  He should continue on an      aspirin if this is not contraindicated from the standpoint of his      liver or platelet disorder.  I do not see that he is on one of      these yet, probably for those reasons.  Given his recent stridor      and his need for beta agonist I would avoid beta-blockers.  There      is not an indication for heparin at this point.  2. Chronic venous insufficiency.  His lower extremities actually do      not have much edema.  No further therapy is warranted.  3. Altered mental status per the primary team.  4. Hypertension.  Blood pressure is currently well-controlled and he      will continue to have his outpatient meds restarted as he can when      he is again awake and taking p.o.'s.      Rollene Rotunda, MD, [facility]  Electronically Signed     JH/MEDQ  D:  04/09/2007  T:  04/09/2007  Job:  [id number]   cc:   Raynelle Fanning A. Mayford Knife, M.D.

## 2010-05-21 NOTE — Consult Note (Signed)
NAME:  CARSTON, RIEDL NO.:  [id number]   MEDICAL RECORD NO.:  [id number]          PATIENT TYPE:  INP   LOCATION:  3715                         FACILITY:  [facility]   PHYSICIAN:  Wilson Singer, M.D.DATE OF BIRTH:  12-20-1945   DATE OF CONSULTATION:  DATE OF DISCHARGE:                                 CONSULTATION   This is a 65 year old man who is hospice eligible with a diagnosis of  end-stage liver disease.  He has a history of alcoholic cirrhosis of  liver and also a history of hepatitis C.  His Palliative Performance  Score is currently 40% at best.  His albumin is 1.7, INR 1.4, and total  bilirubin 2.5.  He also has a degree of renal insufficiency with a  creatinine of 2.69 and a BUN of 61.  He has had episodes of hepatic  encephalopathy and ascites.  His comorbid conditions include  hypertension, status post biliary drain from acute cholecystitis and  renal failure.  Should the disease run its natural course, the patient  is deemed to have a prognosis of 6 months or less.      Wilson Singer, M.D.  Electronically Signed     NCG/MEDQ  D:  01/20/2008  T:  01/21/2008  Job:  [id number]

## 2010-05-21 NOTE — Discharge Summary (Signed)
NAME:  Stephen Rojas, Stephen Rojas NO.:  [id number]   MEDICAL RECORD NO.:  [id number]          PATIENT TYPE:  INP   LOCATION:  4711                         FACILITY:  [facility]   PHYSICIAN:  Paula Compton, MD        DATE OF BIRTH:  25-Feb-1945   DATE OF ADMISSION:  12/01/2007  DATE OF DISCHARGE:  12/10/2007                               DISCHARGE SUMMARY   PRIMARY CARE PHYSICIAN:  Marisue Ivan, MD, [facility]   DISCHARGE DIAGNOSES:  1. Acute cholecystitis.  2. Klebsiella/Escherichia coli bacteremia.  3. Cirrhosis.  4. Lower extremity edema.  5. Thrombocytopenia/anemia.  6. Hypotension.  7. Hypokalemia.  8. Chronic obstructive pulmonary disease with history of tobacco use.  9. Influenza-like illness.   DISCHARGE MEDICATIONS:  1. Aspirin 81 mg by mouth daily.  2. Enalapril 20 mg by mouth daily.  3. Lexapro 10 mg by mouth daily.  4. Folic acid 1 mg by mouth daily.  5. Lasix 40 mg by mouth daily.  6. Lactulose 45 mg 3 times daily.  7. Prilosec 20 mg 2 tablets daily.  8. Propranolol 60 mg daily.  9. Spironolactone 100 mg daily.  10.Thiamine 100 mg daily.  11.Spiriva 18 mcg 1 capsule inhale daily.  12.Bactrim DS take 2 tablets twice daily x12 days to complete a 14-day      course.   CONSULTATIONS:  GI, Interventional Radiology, and Surgery.   PROCEDURES:  1. Chest x-ray on November 28, 2007.  Impression:  Mild bronchitic      changes.   1. CT angio on November 28, 2007. Impression:      a.     No pulmonary emboli seen.      b.     Two 8-mm subpleural nodular densities in the inferior aspect       of the right upper lobe.  These were of doubtful clinical       significance.  Followup chest CT is  recommended in 6-12 months to       assess stability.      c.     Changes of cirrhosis with associated splenomegaly and       ascites.      d.     Cholelithiasis.   1. Abdominal ultrasound on December 05, 2007. Impression:      a.     Gallbladder wall  thickening, sludge, gallstones, and       positive sonographic Murphy's sign, highly suspicious for acute       cholecystitis.  However, gallbladder wall thickening can also be       reactive in the presence of ascites.  If the clinical course is       equivocal, nuclear medicine hepatic biliary scan should be       considered, but may be limited by the underlying hepatic cellular       disease.      b.     Cirrhosis with splenomegaly (mildly increased in 2006 and       ascites).  The pancreas and aorta were not  visualized.   1. Nuclear medicine hepatobiliary imaging on December 05, 2007.      Impression:      a.     Nonvisualization of the gallbladder consistent with cystic       duct obstruction/acute cholecystitis.      b.     Delayed excretion of Choletec by the liver consistent with       the given history of hepatocellular disease/cirrhosis.   1. Cholecystostomy was performed on December 06, 2007.  Impression:      Successful cholecystostomy.  The drain needs to remain in place at      least 6 weeks.   1. Cholangiogram on December 09, 2007.  Impression:  Cholecystostomy      tube within the gallbladder.  Cystic duct and CBD are open and      demonstrate no evidence of obstruction.  Plans are to leave the      current tube in place to gravity drainage over the next 4-6 weeks      with additional cholangiogram performed prior to possible tube      removal at that time.   DISCHARGE LABORATORY DATA:  White blood cell count 6.0, hemoglobin 10.6,  hematocrit 31, and platelet 116.  Sodium 135, potassium 3.5, chloride  103, CO2 24, glucose 110, BUN 10, creatinine 0.83, and calcium 8.2.  Total bilirubin 1.0, direct bilirubin 0.4, alk phos 68, AST 67, ALT 94,  total protein 71, and albumin 2.2.  Cultured bile fluid, moderate  Enterococcus species.  Blood cultures from December 02, 2007, grew  Klebsiella pneumoniae as well as E. coli x2.  These were both sensitive  to Bactrim.  Repeat  cultures from December 08, 2007, were both negative.   BRIEF HOSPITAL COURSE:  Mr. Stephen Rojas is a 65 year old man with end-  stage liver disease that presented with acute cholecystitis but  underwent percutaneous drain on December 06, 2007.  He was also found to  have Klebsiella and E. coli bacteremia.   1. Acute cholecystitis.  A percutaneous drain was placed by      Interventional Radiology with instructions for the drain to remain      for 6 weeks.  A subsequent cholangiogram on December 09, 2007 did      indicate multiple gallstones, but showed a patent cystic duct.  The      patient was instructed to follow up with Interventional Radiology      on January 10, 2008 at 1 p.m. for a followup cholangiogram and      possible removal of his percutaneous drain.  Upon discharge, the      patient's abdominal pain was minimal and he was discharged with      only ibuprofen to control his pain.  He remained afebrile for over      48 hours before discharge.  Surgery was consulted during his stay      and determined that he is not a surgical candidate at this time      secondary to his end-stage liver disease and thrombocytopenia.  2. Klebsiella/E. coli bacteremia.  This was determined from 2 blood      cultures on December 02, 2007.  The patient was placed on Zosyn for      2 days and then changed to Rocephin for 2 days.  As both bacteria      were sensitive to Bactrim, he was subsequently transitioned to p.o.      Bactrim DS, take 2  b.i.d. for 14 days.  Cultures from December 08, 2007 remained no growth.  3. Cirrhosis.  This is secondary to alcohol and hepatitis C.  His LFTs      remained stable throughout his stay.  See above for discharge liver      function tests.  The patient is working on getting on the liver      transplant list with his primary care physician.  4. Chest pain upon admission.  MI was ruled out by serial cardiac      enzymes.  The patient remained on a tele bed  throughout his stay      and continued his aspirin and propranolol.  It was noted that he      had a negative Myoview on April 2009.  5. Lower extremity swelling or fluid overload.  The patient's home      Lasix dose was stopped upon admission as sepsis was a concern upon      admission.  Before discharge, the patient's Lasix 40 mg p.o. daily      was restarted as well as his increased dose of spironolactone at      100 mg daily.  This regimen had him diuresed well and he was      discharged on his previous home dose of Lasix as well as the      increased dose of spironolactone.  6. Thrombocytopenia/anemia.  This is secondary to his cirrhosis.  His      hemoglobin did remain stable as well as did his platelets.  7. Hypertension.  His home medicines of enalapril and propranolol were      continued throughout his stay.  8. Hypokalemia.  Early in admission, he was found to be hypokalemic      and his potassium was replaced and this problem was resolved.  9. COPD.  The patient has a history of tobacco use.  His home      medications of Spiriva and albuterol were maintained in the      hospital.  He had no problems.  10.Influenza-like illness, resolved throughout his stay.  The patient      did finish 5 days of Tamiflu with the last dose on December 07, 2007.  11.Incidental CT findings.  Two 8-mm subpleural nodular densities were      found in the inferior aspect of the right upper lobe.  These were      noted of doubtful clinical significance, but a followup chest CT      was recommended for 6-12 months from now to assess stability of      these nodules.   DISCHARGE INSTRUCTIONS:  The patient was advised to follow up with his  PCP, Dr. Burnadette Pop at [facility] on December 16, 2007  at 2:30.  He also has an appointment to see Interventional Radiology on  January 10, 2008 at 1 p.m. to have a followup cholangiogram and possible  removal of his percutaneous drain.  We have  instructed to call his PCP  or go to the emergency department if he develops severe abdominal  pain/chest pain or if his drain site looks inflamed or if he develops  fever.   The patient was discharged home in stable medical condition with Home  Health RN to help change his dressing.      Helane Rima, MD  Electronically Signed      Paula Compton, MD  Electronically Signed    EW/MEDQ  D:  12/12/2007  T:  12/13/2007  Job:  [id number]   cc:   Marisue Ivan, MD

## 2010-05-21 NOTE — Consult Note (Signed)
NAME:  Stephen Rojas, Stephen Rojas NO.:  [id number]   MEDICAL RECORD NO.:  [id number]          PATIENT TYPE:  INP   LOCATION:  2915                         FACILITY:  [facility]   PHYSICIAN:  Currie Paris, M.D.DATE OF BIRTH:  09-Dec-1945   DATE OF CONSULTATION:  12/06/2007  DATE OF DISCHARGE:                                 CONSULTATION   REQUESTING PHYSICIAN:  Wayne A. Sheffield Slider, MD   REASON FOR CONSULTATION:  Cystic duct obstruction on HIDA post  percutaneous drainage.   HISTORY OF PRESENT ILLNESS:  [name] is a 65 year old male patient  with history of alcoholic and hepatitis C related cirrhosis.  He quit  alcohol about 8 months ago in anticipation of getting placed on a liver  transplant list.  He presented to the ER on Wednesday with complaints of  chest and epigastric pain.  Apparently was having fever, nausea, and  vomiting.  He was admitted for complete workup by the Teaching Service.  Blood cultures were obtained upon admission and they were later positive  for pansensitive E. coli and Klebsiella resistant to ampicillin.  The  patient had some chronic upper abdominal pain but has had a different  right upper quadrant abdominal pain prior to admission as well as the  epigastric pain.  He subsequently underwent an ultrasound of the  abdomen, which revealed sludge and stones and a significant gallbladder  wall thickening with a normal common bile duct, pericholecystic fluid,  ascites and a sonographic Murphy sign.  The radiologist also notes that  some of these findings, such as the gallbladder wall thickening and  pericholecystic fluid are consistent with changes related to cirrhosis.  HIDA scan was recommended and the patient did undergo this that again  shows delayed emptying related to cirrhosis, but the patient also had  some delayed emptying even after morphine that was considered to be  consistent with cystic duct obstruction.  Because of these findings, the  Teaching Services ordered a percutaneous cholecystostomy tube and this  has just been placed today by Interventional Radiology with a large  volume of bilious returns noted and the patient apparently tolerated the  procedure well.  The patient has not had any leukocytosis since  admission.  He has had two episodes of mild neutropenia with a white  count less than 5000.  Gastroenterology has been consulted.  The PA has  seen the patient but the physician is yet to see the patient, so a  definite plan has not been formulated i.e. regarding potential for ERCP  or other GI issues.  Surgical consultation has been requested.   REVIEW OF SYSTEMS:  As per the history of present illness.  The  patient's daughter is in the room and has clarified some of the history.  Again, the patient reports chronic upper abdominal pain but the pain now  is different.  Otherwise, review of systems categories are negative or  noncontributory.   SOCIAL HISTORY:  The patient quit smoking 3 years ago.  He quit alcohol  8 months ago.   FAMILY MEDICAL HISTORY:  Noncontributory.  PAST MEDICAL HISTORY:  1. Alcoholic and had few related cirrhosis with associated      thrombocytopenia, normal coag panel.  2. COPD with history of tobacco abuse.  3. Hypertension.  4. Peripheral vascular disease and lower extremity claudication.  5. Recorded history of non-ST segment elevated MI.  6. GERD.   PAST SURGICAL HISTORY:  Knee surgery.   ALLERGIES:  CODEINE.   CURRENT MEDICATIONS:  Aldactone, lactulose, Lexapro, vitamin B1, folic  acid, Spiriva, Flovent, aspirin, Protonix, Inderal, Tamiflu.  When the  patient was admitted, there was concern that he may have H1N1.  Zosyn,  morphine, Vasotec, potassium, other various p.r.n. medications for  symptom management.   PHYSICAL EXAMINATION:  GENERAL:  Pleasant and mildly confused male  patient complaining of abdominal pain mainly in the right upper  quadrant.  VITAL SIGNS:   Temperature 98.3, BP 151/59, pulse 82 and regular,  respirations 18.  PSYCH:  The patient is alert but mildly confused but affect is  appropriate to current situation.  He is aware he is in the hospital.  NEURO:  Cranial nerves II-XII are grossly intact.  He is moving all  extremities x4 without any tremors or asterixis noted.  ENT:  Sclerae are icteric.  Ears are symmetrical.  No otorrhea.  Nose is  midline.  No rhinorrhea.  ORAL:  Mucous membranes are pink and moist.  CHEST:  Bilateral lung sounds are clear to auscultation but very  diminished in the bases.  The patient was examined supine.  O2 is in  place.  CARDIOVASCULAR:  Heart sounds are S1-S2 without obvious murmurs, rubs,  or gallops.  No JVD is noted but again, the patient was examined in the  supine position so this would be inaccurate, no peripheral edema,  although the patient does have skin changes consistent with chronic  edema.  He has hemosiderin changes in a stocking pattern below the knees  bilaterally.  ABDOMEN:  Distended and tympanitic, questionable fluid wave.  There is  spider angiomata across the abdomen consistent with cirrhotic disease.  He is tender in the right upper quadrant with a positive Murphy sign,  mildly tender in the left upper quadrant.  Bowel sounds are present but  diminished.  EXTREMITIES:  Symmetrical without cyanosis or clubbing.   LABORATORY DATA:  Blood cultures as noted.  White count 5100, hemoglobin  11.6, platelets 47,000 with nadir on the platelets at 36,000 and a peak  of 63,000.  Sodium 139, potassium 3.3, CO2 22, glucose 99, BUN 10,  creatinine 0.64, INR 1.2.  PT 18.2, ammonia is 45, AST 129, ALT 184,  alkaline phosphatase normal, total bilirubin 1.5, peak is 2.1, all of  these LFTs have been stable.   DIAGNOSTICS:  Ultrasound of the abdomen as well as HIDA scan as noted in  the history of present illness.   IMPRESSION:  1. Right upper quadrant abdominal pain with stones and  sludge with an      abnormal HIDA scan.  2. Known cirrhosis secondary to alcohol and hepatitis C with      associated thrombocytopenia, normal coagulation pattern.  3. Klebsiella and Escherichia coli bacteremia, presumed secondary from      gallbladder.  4. Chronic obstructive pulmonary disease, history of tobacco abuse.  5. Hypertension.  6. Peripheral vascular disease and claudication.  7. History of non-ST segment elevated myocardial infarction.  8. History of gastroesophageal reflux disease.   PLAN:  1. At this time, would continue conservative medical treatment for  this issue, percutaneous drain in place for at least 6 weeks.  At      this point, it is difficult to determine if the abnormal findings      on ultrasound and HIDA are all secondary to reactive changes of      cirrhosis and ascites or with an associated low-grade      cholecystitis.  2. Plus or minus if GI needs to do any other procedure such as an ERCP      normally that is not indicated for a cystic duct obstruction.  The      patient's common bile duct is normal, therefore, he likely does not      have choledocholithiasis.  I suspect that the cystic duct      obstruction is related to edema and sludge at the neck of the      gallbladder and with drainage of the gallbladder, this will improve      slowly over time.  Agree with antibiotic therapy based on blood      culture results.  At this time, the patient does not appear to be a      surgical candidate for cholecystectomy due to severe cirrhosis and      splenomegaly as well as associated  thrombocytopenia.  He is at      significant high risk for intraoperative bleeding and potential      death.      Allison L. Rennis Harding, N.P.      Currie Paris, M.D.  Electronically Signed    ALE/MEDQ  D:  12/06/2007  T:  12/07/2007  Job:  [id number]   cc:   Deniece Portela A. Sheffield Slider, M.D.

## 2010-05-21 NOTE — Discharge Summary (Signed)
NAME:  MABLE, LASHLEY NO.:  [id number]   MEDICAL RECORD NO.:  [id number]          PATIENT TYPE:  INP   LOCATION:  5150                         FACILITY:  [facility]   PHYSICIAN:  Santiago Bumpers. Hensel, M.D.DATE OF BIRTH:  [birth year]   DATE OF ADMISSION:  12/21/2007  DATE OF DISCHARGE:  01/06/2008                               DISCHARGE SUMMARY   DISCHARGE DIAGNOSES:  1. Gastroenteritis.  2. Pancreatitis secondary to biliary sludge.  3. Acute cholecystitis, status post cholecystostomy with drain      remaining in place.  4. 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  5. Acute renal failure.  6. Cirrhosis secondary to hepatitis C and alcoholism.  7. Chronic thrombocytopenia.  8. Hypertension.  9. Depression.  10.Chronic obstructive pulmonary disease.   DISCHARGE MEDICATIONS:  1. Aspirin 81 mg daily.  2. Lexapro 10 mg daily.  3. Lasix 20 mg daily; change in dose.  4. Spironolactone 50 mg daily; change in dose.  5. Lactulose 30 mL daily as directed for bowel movements, goal of 2-3      per day.  6. Prilosec 20 mg 2 tablets daily.  7. Multivitamin daily.  8. Propranolol 60 mg 1 p.o. daily.  9. Albuterol inhaler p.r.n.  10.Flovent 220 mcg 2 puffs b.i.d.  11.Mirtazapine 15 mg at bedtime which is a new medicine.  12.Diflucan 400 mg daily x5 more days which is a new medicine.  13.Percocet 5/325 mg 2 tablets q.6 h. p.r.n. pain.   MEDICATIONS TO DISCONTINUE:  1. Enalapril 20 mg daily.  2. Thiamine.  3. Folate.   CONSULTS:  1. Surgery:  [facility].  2. Gastroenterology:  Dr. Randa Evens   PROCEDURES AND IMAGING:  1. 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  2.  Cholangiogram, December 23, 2007, showing patent cholecystostomy in      good position and no biliary obstruction.  3. Cholangiogram, December 30, 2007, showing cholecystostomy tube in      good position but no filling of duodenum consistent with stricture,      spasm or obstructing lesion.  So, cholecystostomy tube was upsized      to 12 [place].  4. Paracentesis done January 05, 2008, yielding 2.5 L of thin, straw-      colored fluid.   DISCHARGE LABORATORIES:  Sodium 134, potassium 4.7, chloride 103, bicarb  23, BUN 30, creatinine 1.72, total bilirubin 2.8, alkaline phosphatase  77, AST 110, ALT 85, total protein 7, albumin 2.1, calcium 8.4, lipase  normal at 29, SAAG greater than 1.1 from ascitic fluid and cell count  showing 910 white blood cells with 6% segmented neutrophils.  White  blood cell 5.2, hemoglobin 10.9, platelets 83.   HOSPITAL COURSE:  For full summary, please see dictated history and  physical.  In short,  this is an unfortunate 65 year old gentleman with  history of cirrhosis secondary to hepatitis and alcoholism and recent  hospitalization for acute cholecystitis with Klebsiella and E. coli  bacteremia, status post treatment, status post biliary stent and drain  placed who was readmitted this hospitalization for persistent nausea,  vomiting, diarrhea and dehydration.  He was found to have VRE in his  biliary system and pancreatitis from biliary sludge filling biliary duct  system.  1. Abdominal pain:  Patient admitted initially with concern of      gastroenteritis.  His LFTs and lipase increased during      hospitalization.  Gastroenterology was consulted, and they believe      that this was secondary to irritation of the pancreas from flushing      of his drain.  Biliary cultures were obtained which showed      vancomycin-resistant Enterococcus growing, and it was unsure      whether this was colonization or infection.  Regardless, he was      treated and  completed his course while in the hospital.  Patient      was also found to have yeast there, so he was treated with      antifungal and is to be sent home to complete a 7-day course of      Diflucan.  Infectious disease was consulted for this, and that was      their plan.  Patient underwent 2 cholangiograms here, the first one      revealing patent ducts, second one revealing possible obstruction.      Therefore, he had cholecystostomy tube exchange on December 30, 2007, and tolerated it well.  For his abdominal pain, it is likely      a chronic process that shall continue given his end-stage liver      disease and biliary drainage.  Patient does have an appointment      scheduled with interventional radiology for January 10, 2008, for      repeat cholangiogram and decision whether to keep the drain in or      remove.  Patient was having upon discharge about 200 to 300 mL of      drain output per day.  Upon admission, he could not tolerate p.o.,      but upon approaching discharge, he was tolerating oral food and      medications better with no nausea or vomiting.  Patient was sent      home with Percocet to control his abdominal pain.  2. Diarrhea:  Initially, there was concern for gastroenteritis as      noted above.  However, lactulose was held, and diarrhea improved;      then, when lactulose was restarted, he again started having      diarrhea.  So, it was believed to be secondary to lactulose.      Patient was sent home with instructions to restart lactulose if he      was not having 2-3 loose stools per day but that this was to be      expected when he is on this medication and to control his ammonia      level.  3. End-stage liver disease:  Mr. Bilyeu was treading a fine line of      fluid status and balance throughout this hospitalization.      Initially admitted with spironolactone 100 mg daily and Lasix 40 mg  daily.  This seemed to worsen his kidney function, so his  diuretics      were decreased to 1/2 dose of 50 mg daily of spironolactone and 20      mg daily of Lasix.  Patient's creatinine did slightly rise when IV      fluids were discontinued.  However, given Mr. Earll does have a      tenuous balance between his liver disease and maintaining his      kidney function, it was decided that he could be discharged with      his creatinine to be further tweaked as an outpatient.  Mr. Weckwerth      potassium was also of concern in that he had slightly elevated      potassium upon admission, and then it dropped when we were making      changes to his diuretics.  This warrants followup outpatient, and      we have asked him to return to family practice center next week for      a complete metabolic panel to monitor his creatinine and potassium      as well as his LFTs.  Of note, he does have an appointment      scheduled with [facility] for liver transplant evaluation.  As he was on      all appropriate medications and was not responding as far as his      ascites and peripheral edema, it was decided prior to discharge to      do a therapeutic and diagnostic tap which he underwent January 05, 2008.  A total of 2.5 L of ascitic fluid were removed, and he      tolerated procedure well.  Fluid analysis revealed that this is      likely all secondary to his cirrhosis and ascites and not likely      due to SBP which was of low probability anyway.  Consider      outpatient therapeutic paracentesis depending on his ascitic status      and abdominal pain.  4. Acute renal failure:  Patient initially presented with elevated      creatinine.  This was secondary to dehydration from his diarrhea      and vomiting.  Patient was started on IV fluids, and his creatinine      returned to baseline of less than 1.  However, upon discontinuing      IV fluids and tweaking his diuretics, his creatinine did slowly      rise to value of 1.7 upon discharge.  It was felt that given  he      does have such difficulty in finding a balance of fluid, just      continue to monitor this outpatient.  5. Hypertension:  Patient's lisinopril was held upon admission, and he      did not need this throughout hospitalization with good control of      blood pressure, so it was decided to discontinue this medication.      Patient was sent home on Lasix 20 mg daily and spironolactone only      as well as propranolol.  6. VRE infection found upon body fluid culture from his biliary drain.      Patient was treated with Tygacil per ID recommendations because      Zyvox has propensity to cause even more thrombocytopenia.  Patient      was also found to  have yeast infection and was treated with      Diflucan.  7. Depression:  Lexapro was continued.  8. COPD:  Albuterol and Flovent were continued, and patient tolerated      these well.   DISPOSITION:  Given Mr. Molner unfortunate situation with his end-stage  liver disease and tenuous fluid balance, discussion was had about how  much more treatment and what degree of aggressiveness he wanted to  pursue.  We also discussed with him and with his daughter regarding poor  prognosis of his medical conditions.  This was a discussion that was  started in the hospital but should be continued as outpatient.  Mr.  Savas understands that he does have a limited prognosis given all his  concomitant diseases.  He states that what is important to him is  spending more time with his daughter as well.  So, according to this and  as dictated above, it was decided to send Mr. Mannella home with close  outpatient followup versus keeping him in the hospital as further  hospital stay would likely not improve his quality of life any further.   FOLLOWUP:  1. Patient will return to Dr. Burnadette Pop on January 18, 2007, at 2:30      p.m.  Patient is to return one week prior to [facility] for [facility].  2. Patient will return to interventional radiology January 10, 2007,      for cholangiogram and decision of whether to continue with the      drain or not.   ISSUES FOR FOLLOWUP:  1. CMP to be drawn next week with following of potassium and      creatinine as well as LFTs.  2. Further discussion of goals of care.  3. Medication changes and ensuring he finishes his antibiotics.  4. We have asked patient to check daily weights at home if able to to      be able to better follow his fluid balance.   Patient sent home with home health RN to assist with dressing changes  and drain emptying with instructions to limit flushes as  gastroenterology thought that this was aggravating pancreas secondary to  biliary sludge backing up into the biliary duct system.  Patient also  sent home with home health physical therapy to continue to help  strengthen and recondition him.  Patient sent home with instructions for  low-sodium diet to help with his liver disease.      Eustaquio Boyden, MD  Electronically Signed      Santiago Bumpers. Leveda Anna, M.D.  Electronically Signed    JG/MEDQ  D:  01/06/2008  T:  01/06/2008  Job:  [id number]   cc:   Marisue Ivan, MD  Llana Aliment. Malon Kindle., M.D.

## 2010-05-21 NOTE — Consult Note (Signed)
NAME:  Stephen Rojas, Stephen Rojas                 ACCOUNT NO.:  [id number]   MEDICAL RECORD NO.:  [id number]          PATIENT TYPE:  INP   LOCATION:  3715                         FACILITY:  [facility]   PHYSICIAN:  Wilson Singer, M.D.DATE OF BIRTH:  [birth year]   DATE OF CONSULTATION:  01/21/2008  DATE OF DISCHARGE:                                 CONSULTATION   Consult is for goals of care.   REQUESTING PHYSICIAN:  Leighton Roach McDiarmid, MD   PHYSICIAN TO APPEAR ON CONSULT:  Wilson Singer, MD   This nurse practitioner reviewed medical records, received report from  team, was unable to assess the patient at initial consult and then  called the patient's daughter, April Manson, phone number [phone] to  discuss diagnosis, prognosis, goals of care, end-of-life wishes,  disposition, and options.  Full assessment was completed at 13:30 p.m.  on the same day, January 21, 2008.   A detailed discussion was had today regarding advance directives,  concepts specific to code status, artificial feeding and hydration,  continued use of antibiotics, rehospitalization, and aggressive  treatment options.  The concept of hospice and palliative care was  offered.  An aggressive medical intervention approach for this patient  in this situation was compared with a comfort/palliative care approach.  The patient's daughter was grateful for the opportunity to process and  discuss this difficult complex decision making situation.  Presently,  she is unable to make any definitive decisions related to goals of care  and request to remeet on Monday with the nurse practitioner in person to  clarify goals of care.  The palliative nurse practitioner will meet with  the patient and the patient's daughter on Monday at 3:00 p.m.   CHIEF COMPLAINT:  Weakness.   HISTORY OF PRESENT ILLNESS:  This is a 65 year old white male with  cirrhosis, history of VRE, pancreatitis, biliary sludge, now with a  biliary drain.  He was  admitted to the hospital on January 19, 2008.  He  has had multiple hospitalizations over the last several months.  During  this hospitalization, he was diagnosed with acute cholecystitis.  The  palliative care consult was requested for today in light of this  patient's complex history and poor long-term prognosis.  As mentioned  prior, a re-goals meeting is scheduled for Monday at 3:00 p.m.   PAST MEDICAL HISTORY:  1. Gastroenteritis.  2. Pancreatitis.  3. Cholecystitis.  4. VRE.  5. Infected biliary drainage system.  6. Acute renal failure.  7. Cirrhosis secondary to hepatitis and alcoholism.  8. Chronic thrombocytopenia.  9. Hypertension.  10.Depression.  11.Chronic obstructive pulmonary disease.   ALLERGIES:  CODEINE, NSAIDS/COX-2 INHIBITORS.   MEDICATIONS REVIEWED:  Please see MAR for active list of meds.   FAMILY HISTORY:  Brother died of an MI at 53.  Father had cirrhosis and  died at 62.  His mother has passed away at age 75, unsure of cause.   SOCIAL HISTORY:  The patient currently lives with his daughter who is  helping to take care of him.  She is not there all the  time.  She works  from morning to early afternoon.  The patient is on disability.  He has  a history of prior IV drug use as well as alcoholism per hospital  records.   REVIEW OF SYSTEMS:  As per mentioned in HPI and per the patient himself.  He has abdominal tenderness, decreased appetite, increased fatigue, and  an overall generalized decline.   PHYSICAL EXAMINATION:  VITAL SIGNS:  Blood pressure 123/73, temperature  97.8, pulse 76, respirations 18, and O2 sats on room air 94%.  GENERAL:  This is an ill-appearing middle-aged white male.  HEENT:  Head is normocephalic and atraumatic.  Eyes:  Pupils are equal  and reactive to light.  Mouth:  Mucous membranes are moist.  No exudate  noted.  NECK:  Supple.  No JVD.  No lymphadenopathy.  LUNGS:  Decreased in the bases.  No advantageous breath sounds.   HEART:  Regular rate and rhythm.  No murmur noted.  ABDOMEN:  Distended with minimal tenderness in the right upper quadrant.  Drain in place.  Dressing is dry and intact.  EXTREMITIES:  Bilateral lower extremities with trace ankle edema.  SKIN:  Warm and dry.  No rash or petechiae noted.  NEUROLOGIC:  The patient is able to follow simple commands.  He is  oriented to person and place.   LABORATORY DATA:  Reviewed from January 21, 2008:  Sodium 143, potassium  4.9, chloride 111, CO2 of 19, glucose 92, BUN 50, creatinine 2.33,  calcium 7.8.  PTT 32.  PT 18.6, INR 1.5.  WBC 5.5, RBC 4.23, hemoglobin  12.4, hematocrit 37.8, and platelet count 108.  Lab data from January 20, 2008:  Total protein 6.4, albumin blood 1.7.   Total time spent on the unit was 40 minutes.  Counseling and  coordination of care composed 50% or greater portion of this  interaction.  Diagnosis and prognosis was clarified.  The concept of  hospice and palliative care was offered.  The team approach to our service was offered.  Again, a continuation of  this goals of care will take place on Monday at 3:00 p.m. as it is much  more conducive to communication for both the patient and daughter to be  face-to-face.  Please call with questions or concerns and palliative  care will support holistically.      Herbert Pun, NP      Wilson Singer, M.D.  Electronically Signed    MCL/MEDQ  D:  01/21/2008  T:  01/22/2008  Job:  [id number]   cc:   Leighton Roach McDiarmid, M.D.

## 2010-05-21 NOTE — Consult Note (Signed)
NAME:  Stephen Rojas, Stephen Rojas NO.:  [id number]   MEDICAL RECORD NO.:  [id number]          PATIENT TYPE:  REC   LOCATION:  FOOT                         FACILITY:  [facility]   PHYSICIAN:  Theresia Majors. Tanda Rockers, M.D.DATE OF BIRTH:  July 22, 1945   DATE OF CONSULTATION:  08/31/2006  DATE OF DISCHARGE:                                 CONSULTATION   REPORT TYPE:  Wound Care and Hyperbaric Medicine consultation.   SUBJECTIVE:  Stephen Rojas is a 65 year old man who is referred by Dr.  Jackalyn Lombard of the [facility] as a primary care physician.  The  referring physician is Enid Baas.   REFERRING DIAGNOSIS:  Of venous insufficiency   IMPRESSION:  Severe bilateral venous insufficiency with stasis and a  history of ulceration.   RECOMMENDATIONS:  Continued medical management of edema and the addition  of bilateral 30-40 mm open toed compression hose.   SUBJECTIVE:  The  patient is a 65 year old male who is a patient of the  [facility] for several years.  He has been followed for management of  chronic obstructive pulmonary disease and gastroesophageal reflux  symptomatology, hypertension and fluid retention.  He was treated for a  post-traumatic stasis ulcer to the lateral aspect of the right lower  extremity over the past 6 weeks with complete resolution.  He was  referred to the wound center for evaluation.   PAST MEDICAL HISTORY:  Is remarkable for no known allergies.   MEDICATIONS:  1. Advair  2. Albuterol  3. Lipitor 10 mg daily  4. Enalapril 10 mg daily  5. Prilosec 40 mg daily  6. Propranolol 60 mg daily  7. Spiriva Inhaler  8. Furosemide  9. Klor-Con 20 mg daily  10.Bacitracin ointment.   PAST SURGERY:  Has included a right knee replacement, an open reduction  internal fixation of bilateral jaw fractures.  He has had a heel  surgery.   PAST MEDICAL HISTORY:  History of hepatitis and emphysema.   FAMILY HISTORY:  Is positive for hypertension and heart attack.   SOCIAL HISTORY:  He is single.  He has two adult children, one lives  remotely, one locally.   REVIEW OF SYSTEMS:  The patient denies visual changes, speech  impediments or transient paralysis.  His weight has been stable in fact  he has gained approximately 3-5 pounds over the past year.  His  ambulation is hampered by shortness of breath.  He specifically denies  calf pain or chest pain.  His bowel and bladder function are described  as normal.  The remainder of his review of systems is negative.   PHYSICAL EXAM:  He is an alert, oriented man in no acute distress.  He  is able to communicate the history without difficulty, appears to be in  good contact with reality.  He is 5 feet 8 inches tall, weighs 235  pounds.  Blood pressure is 156/83, respirations are 20, pulse rate 98,  temperature 98.2  HEENT:  Exam is clear.  NECK is supple.  Trachea is midline.  Thyroid is nonpalpable.  LUNGS were clear.  HEART SOUNDS are distant.  ABDOMEN is soft.  EXTREMITIES are warm.  There is bilateral two to three plus edema with  readily palpable dorsalis pedis pulses bilaterally.  SKIN:  There are no ulcerations whatsoever.  The skin is thin.  There  are chronic changes of stasis including hyperpigmentation and AV  malformations that easily blanch with pressure.  NEUROLOGICALLY the patient retains protective sensation.   DISCUSSION:  The patient has no active ulcerations.  We have explained  the treatment of stasis utilizing external compression in terms that he  seems to understand.  We have provided him with a prescription to a  local vender for the recommended garment.   We have given an opportunity to ask questions.  He seems to understand  and indicates that he will be compliant with discharging him from active  follow-up in the wound center with instruction to return to Dr. Ludwig Clarks  for continued medical management.  The patient seems to understand and  expresses gratitude for having  been seen in clinic and indicates that he  will be compliant.      Harold A. Tanda Rockers, M.D.  Electronically Signed     HAN/MEDQ  D:  08/31/2006  T:  08/31/2006  Job:  [id number]   cc:   Henri Medal, MD

## 2010-05-21 NOTE — Discharge Summary (Signed)
NAME:  Stephen Rojas, Stephen Rojas NO.:  [id number]   MEDICAL RECORD NO.:  [id number]          PATIENT TYPE:  INP   LOCATION:  3025                         FACILITY:  [facility]   PHYSICIAN:  Leighton Roach McDiarmid, M.D.DATE OF BIRTH:  Jul 19, 1945   DATE OF ADMISSION:  01/19/2008  DATE OF DISCHARGE:  01/26/2008                               DISCHARGE SUMMARY   PRIMARY CARE Cassandra Harbold:  Marisue Ivan, MD at [facility].   DISCHARGE DIAGNOSES:  1. Hepatic encephalopathy.  2. Biliary drain infection, growing Klebsiella and enterobacteria.  3. 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  4. Acute renal failure.  5. Chronic cholecystitis and biliary sludge.  6. History of non-ST segment elevation myocardial infarction.  7. History of vancomycin-resistant Enterococcus and candidal biliary      drain infection.  8. Hypertension.   DISCHARGE MEDICATIONS:  1. Lactulose 30 mL by mouth three times a day.  2. Prilosec OTC 20 mg 2 tablets by mouth daily.  3. Propranolol 60 mg, take 1 tablet by mouth three times a day.  4. Lexapro 10 mg by mouth daily.  5. Flovent 220 mcg 2 puffs inhaled twice a day.  6. Albuterol 2 puffs every 4 hours as needed for wheezing.  7. Ceftriaxone 1000 mg IV daily, stop on February 04, 2008.  8. Oxycodone 5 mg 1 tablet by mouth every 4 hours as needed for pain      as well.   CONSULTS:  1. Gastroenterology.  2. Interventional Radiology.  3. Infectious Disease.  4. Palliative Care.   PROCEDURES:  1. CT scan of the head:  No gross acute findings.  2. CT scan of the abdomen:  Cirrhosis, moderate abdominal ascites,      possible right hepatic lobe mass, possible portal venous      thrombosis, indeterminate right lower lobe pulmonary nodule,      advanced atherosclerotic disease.  3. Chest x-ray:  No acute significant findings.  4. Ultrasound of the abdomen, small volume of ascites.  5. Cholangiogram:  Confirms patency of the cystic duct,  partially      occluded cholecystectomy.  Catheter was removed.  6. Attempted paracentesis that was unsuccessful.  7. PICC line placement.   ADMISSION LABORATORY DATA:  1. Creatinine 3.3.  2. Total bilirubin 2.8.  3. CBC:  WBC 5.7, hemoglobin 12.7, and platelets 165.  4. Ammonia level 85.  5. Lactic acid 3.5.  6. Troponin 0.09.   OTHER LABORATORY DATA:  1. Creatinine on the day of discharge 1.98.  2. Bilirubin at the time of discharge 2.5.  3. CBC on day of discharge:  WBC 6.3, hemoglobin 12.9, and platelet      count 103.  4. Blood culture x2, no growth.  5. Biliary drain culture:  Moderate Enterobacter cloacae and moderate      Klebsiella pneumonia.  6. Ammonia on January 22, 2008.  7. Followup troponins 0.02.   BRIEF HOSPITAL COURSE:  The patient is a 65 year old male with known  cirrhosis and cholecystitis who was admitted for altered mental status  and abdominal pain.  1. Altered mental status.  This was most likely due to hepatic      encephalopathy.  The patient was being noncompliant with his      lactulose at home.  The patient was not having adequate number of      stools per day.  Ammonia level at the time of admission was      elevated to 85.  We titrated the patient's lactulose until he was      having adequate stools daily and until his mental status improved.      The patient cognitively improved throughout his hospital stay.  At      the time of discharge, the patient was much more alert and oriented      at his baseline of alert and oriented x2.  The patient was having      adequate bowel movements with his home dose of lactulose 30 mL      t.i.d.  The patient's mental status greatly improved, which      corresponded with decreasing ammonia levels.  The patient is      resistant to take lactulose and recommended witnessing the patient      taking his lactulose.  2. Abdominal pain and distention.  The patient had significant      abdominal pain and distention  on admission.  The patient does have      known history of cirrhosis and cholecystitis, which he had a      biliary tube drain placed because he was not a surgical candidate      for cholecystectomy.  It was felt that the patient might have a      fair amount of ascites, so Interventional Radiology was consulted      to attempt a paracentesis.  There was not a significant amount of      ascitic fluid with paracentesis, so this was not felt to be      contributing to his abdominal pain.  We felt that most likely      related to his chronic cholecystitis and his biliary sludge.  Also      of note when the patient was going at first paracentesis, his      biliary tube drain slipped back and subsequently had to be removed.      Interventional Radiology recommended following his bilirubin      levels, which remained stable throughout his hospital stay and was      at 2.5 at the time of discharge.  The patient's abdominal pain and      distention at the time of discharge was controlled adequately with      oxycodone 5 mg every 4 hours as needed for pain.  3. Biliary drain infection.  The patient had a biliary drain culture,      which grew out Enterobacter cloacae and Klebsiella pneumonia.      Infectious disease was consulted who recommended treating the      infections with IV ceftriaxone 1000 mg daily x10 days.  The patient      was pacing out a PICC line prior to discharge, so we can receive      ceftriaxone at home.  Infectious Disease also initially recommended      continuing Tygacil and fluconazole because of his history of VRE      and Candida drain infections.  However, after the cultures did not      grow VRE or Candida, the Tygacil and  fluconazole was discontinued.  4. Hypertension.  The patient did have some elevated systolic blood      pressures while in the hospital.  The patient was started on      propranolol 10 mg t.i.d., which seemed to stabilize blood      pressures.  On the  day of discharge, the patient's blood pressure      remained elevated at 125-150.  The patient's propranolol was      increased to 60 mg t.i.d.  5. Acute renal failure.  The patient came in with a creatinine of 3.3.      He was not tolerating much p.o.'s.  Throughout his hospital stay,      the patient's creatinine improved with hydration, so we felt this      most likely prerenal acute kidney failure.  At the time of      discharge, the patient was still not tolerating great p.o.'s, but      he was taking in some food and liquids by mouth.   DISCHARGE INSTRUCTIONS:  The patient is to return to the hospital with  any acute worsening of his mental status, if his abdominal pain becomes  much more worse, if he becomes more confused, or he is unable to  tolerate anything by mouth.  I have recommended caregiver or home health  aide to monitor the patient's lactulose and make sure that he takes it.  The patient has a history of noncompliance with lactulose.  The patient  is to have home health hospice help take care of him.  The patient also  will have arranged 24-hour supervision, rolling walker, bedside commode,  3 in 1, and a wheelchair for home.  The patient is to have a low-sodium  heart-healthy diet.  The patient is to be cautious with activity and to  only move around when supervised.   FOLLOWUP:  The patient is to follow up with Dr. Burnadette Pop at [facility], phone number [phone].  The patient is to call and  schedule appointment within 1 week.   DISCHARGE CONDITION:  Stable.      Angelena Sole, MD  Electronically Signed      Leighton Roach McDiarmid, M.D.  Electronically Signed    WS/MEDQ  D:  01/26/2008  T:  01/27/2008  Job:  [id number]

## 2010-05-21 NOTE — H&P (Signed)
NAME:  Stephen Rojas, Stephen Rojas NO.:  [id number]   MEDICAL RECORD NO.:  [id number]          PATIENT TYPE:  INP   LOCATION:  2603                         FACILITY:  [facility]   PHYSICIAN:  Leighton Roach McDiarmid, M.D.DATE OF BIRTH:  1945/09/15   DATE OF ADMISSION:  01/19/2008  DATE OF DISCHARGE:                              HISTORY & PHYSICAL   CHIEF COMPLAINT:  Altered mental status and weakness.   HISTORY OF PRESENT ILLNESS:  The patient is a 65 year old Caucasian male  with cirrhosis, history of VRE, status post pancreatitis, biliary sludge  now with a biliary drain.  He was hospitalized December 01, 2007,  through December 10, 2007.  During this hospitalization, the patient was  diagnosed with acute cholecystitis, and he had a biliary drain in place  because he was not a surgical candidate on December 06, 2007.  He  subsequently returned to the hospital on December 21, 2007, through  January 06, 2008.  During his hospitalization, he had a biliary culture  showing VRE and a candidal infection, he was treated for these.  Also  during this time, he had a cholangiogram that showed a possible  obstruction of his biliary drainage tube.  The patient did have another  cholangiogram done on January 10, 2008, after he left the hospital which  also showed a possibility of an obstruction going on.  In regards to the  history from the patient and his daughter, we are unable to get a  history from the patient due to his altered mental status.  The  patient's daughter has been caring for him at home.  Her name is Marylene Land,  she has power of attorney now.  According to her yesterday evening, the  patient lost all feeling in his arms and legs.  He was weak in his legs  and difficult to transfer to and from places.  He had trouble walking  with his cane, shaking was noted in his arms.  This morning he looked  very distant, he fell off of the bed.  Also fell yesterday at 4:00 p.m.  and injured his  left shoulder and hip.  The patient has not been getting  his lactulose as much as before he was getting this once a day now.  Daughter thinks he has had 1-2 bowel movements yesterday which is less  than he was having when he left the hospital.  He is supposed to have 2-  3 per day.  She says it is hard for her to give him the lactulose  because she works during the day.  The patient's biliary drain is  currently draining more than it did before.  [facility]  times a week to change the dressing.  Daughter also changes them on the  days when home health does not come.  They change with bandage 2 times a  day and apply gauze around the drain.  Currently, it is leaking yellow  fluid all over his abdomen.  He went to see Interventional Radiology on  January 10, 2008, and had a cholangiogram that  showed a possible blockage  at the ampulla.  According to the daughter, there was no followup  appointment made.  Also, she says that he has not seen any other  physician other than Dr. Burnadette Pop since his hospitalization.  We did  discuss code status with the daughter.  The daughter is okay with him  being in no code blue.  She wants him to be there when he passes away  and not have a pair of doctors around him.  Would like to discuss having  Hospice help out with them at home.  She is still planning to going to  [facility] for the appointment regarding the possible liver transplant on  February 10, 2008.  On review of systems, daughter also mentioned that he  has some chest pain this morning.  No fevers.  He is short of breath at  times, does have blood in his stool at times, but thinks this is because  of his hemorrhoids, does have some nausea, but no vomiting.  Also having  some abdominal pain.  No appetite, but was drinking well yesterday, has  some left shoulder pain after his fall.  No dysuria.  No hematuria, some  redness around his biliary drainage site.   PRIOR MEDICATIONS:  1.  Albuterol 2 puffs q.4 h. p.r.n. wheezing.  2. Prilosec OTC 20 mg, take 2 tablets by mouth once a day.  3. Propranolol 60 mg take 1 tablet by mouth daily.  This is a long-      acting form of propranolol, this ER form.  4. Lexapro 10 mg take 2 tablets by mouth daily.  I am unclear if he is      taking 2 tablets of this or 1 tablet daily.  5. Flovent 220 mcg 2 puffs inhaled b.i.d.  6. Lasix 20 mg 1 tablet daily.  7. Lactulose 30 mL by mouth daily to titrate fair goal of 2-3 bowel      movements per day.  8. One multivitamin daily.  9. Mirtazapine 15 mg at bedtime.  10.Percocet 5/325 mg 2 tablets q.6 h. p.r.n. pain.   ALLERGIES:  The patient has allergy to CODEINE.  We do not know what  they are.  Also he is not supposed to have any NSAIDs.   PAST MEDICAL HISTORY:  The patient was hospitalized on December 21, 2007, to January 06, 2008.  At this point, his drain was still in  place.  His tube for his drain was changed on December 30, 2007, because  of a cholangiogram showed no filling of the duodenum consistent with  structures, spasm, or obstructing lesion.  Vancomycin-resistant  pneumococcus and Candidal infection of the biliary drainage system.  The  patient was also hospitalized on December 01, 2007, through December 10, 2007.  At this initial hospitalization, he had acute cholecystitis.  He  had Klebsiella and E. coli bacteremia.  He had influenza-like illness.  On December 06, 2007, a biliary drain was placed.  At this  hospitalization, he has been not to be a surgical candidate due to his  end-stage liver disease and his low platelets.  In his right lower lobe,  he does have nodules in his lung on CT.  His GI doctor is Dr. Amil Amen.  The patient also suffers from depression.  He has thrombocytopenia.  His  platelet baseline is 40-60.  He also has coronary artery disease, status  post NSTEMI in 2009.  According to the past medical history we  have, he  also had a normal Cardiolite  in October 2007.  However, they were unable  to catheterize him due to his risk of bleeding.  Normal stress test in  March 2009.  He does have hypertension as well as has intermittent  claudication and venous insufficiency, alcohol abuse, and the past  history of liver cirrhosis with portal hypertension, hepatitis C,  gastritis/antral ulcers in March 2008, descending colon adenomas x5 in  March 2008, small internal and external hemorrhoids, history of GI bleed  in 2004, COPD, history of tobacco use, quit in 2005.   PAST SURGICAL HISTORY:  The patient had a right knee replacement in  2005.  He also had right knee surgery in 1970.  He had a biliary drain  placed on December 06, 2007.  Resting echocardiogram was done in April  2009 showed an EF of 70-80%.   FAMILY HISTORY:  Brother died of an MI at age 30, also had hypertension.  Father had cirrhosis, died at 42.  Mom passed away at 22, unsure of  cause.   SOCIAL HISTORY:  The patient currently lives with his daughter, who is  helping to take care of him, however, she does not stay at home all the  time.  She does work and he is alone when she works.  She would prefer  that he  stay in the house with her, would like to pursue hospice,  is  okay with him to begin no code blue.  The patient quit drinking in March  2009 upon admission to the hospital.  He is on disability.  He has a  history of prior IV drug use as well as alcoholism.   PHYSICAL EXAMINATION:  VITAL SIGNS:  Temperature is 97.7, heart rate is  70, respiratory rate 16, and blood pressure 103/39.  GENERAL:  The patient has altered mental status.  He is drowsy.  He  arouses to voice and when viewed by opening his eyes for a few seconds  and mumbles.  He does not say any understandable words.  HEENT:  Head, normocephalic and atraumatic.  Eyes, pupils equal, round,  and reactive to light, but unable to follow commands.  Intact  extraocular motions.  Mouth, would not open his  mouth.  Lips slightly  dry.  NECK:  Supple.  No masses.  No lymphadenopathy.  LUNGS:  Normal respiratory effort.  Normal breath sounds.  No crackles  and no wheezes.  HEART:  Regular rate and rhythm.  No murmurs, rubs, or gallops.  ABDOMEN:  Soft, but distended.  Normal bowel sounds and positive  tenderness, but difficult to assess due to altered mental status.  Cholecystostomy drain in place with only mild erythema at drain  insertion site, but does not appear infected.  No purulent drainage.  Yellow visible leakage around tube incision area draining bilious fluid  into bag.  RECTAL:  Hemoccult negative in ED.  No abnormalities on rectal exam done  by Dr. McDiarmid.  GENITALIA:  Uncircumcised male.  Foley in place.  No erythema noted.  MUSCULOSKELETAL:  Moving all extremities, not following commands.  Would  move left arm, does not appear to be in much pain.  Pulses are 2+  dorsalis pedis and radial pulses bilaterally.  EXTREMITIES:  2-3+ pitting edema bilaterally.  NEUROLOGIC:  Not following commands, unable to tell us anything, not  oriented to time, dates, and also place; shaky, seems to have asterixis  of hands/clonus.  SKIN:  Bruise in the  left shoulder and left toe.  No lacerations or  bruises otherwise.  PSYCH:  Not oriented, mumbling and drowsy.   LABORATORY DATA:  Point-of-care enzymes were negative x1.  INR and PTT  were normal.  Ammonia was high at 85, lactic acid was high at 3.5.  Sodium 138, potassium 5.6, chloride 106, bicarb 22, BUN 64, creatinine  3.33, glucose 85, total bili 2.8, ALP 102, AST 133, ALT 171, total  protein 7.3, albumin 1.8, calcium 8.5, and lipase 29.  CBC, white blood  cell count 5.7, hemoglobin of 12.7, hematocrit 38.6, and platelets 165.  Urine specific gravity of 1.023, small bili, ketones 15, leukocyte  esterase small, nitrite negative, rare squamous epithelial  cells,  hyaline casts, and white blood cells 0-2.  Blood cultures x2 pending.   Urine culture pending.   EKG, heart rate 74, normal sinus rhythm.  No ST changes.  Head CT,  technically difficult study, limited by motion artifact.  No gross acute  findings.  Stable changes of atrophy and microvascular white matter  disease.  Chest x-ray, no acute or significant findings.  Peribronchial  thickening without active airspace disease.  CT of abdomen and pelvis,  cirrhosis, drainage catheter in place, moderate abdominal ascites,  diffuse heterogeneous appearance of the right hepatic lobe concerning  for right hepatic lobe mass/hepatoma without contrast cannot exclude  possibility of infection or infarction, but these are much less likely  findings worrisome for portal venous thrombosis, cannot exclude portal  vein tumor and determine the right lower lobe pulmonary nodule, advanced  atherosclerotic disease.  Cholangiogram on January 10, 2008, contrast  empties into the duodenum, but appears to be nearing at the ampulla.  Findings similar to the cholangiogram on December 23, 2007.  It could be  related to a stricture, but a lesion cannot be completely excluded.   ASSESSMENT AND PLAN:  1. Altered mental status.  This is likely due to hepatic      encephalopathy and not taking enough lactulose, however, acute      renal failure could also be playing a role.  We will increase      lactulose to 60 mL 3 times a day.  Neuro checks every 2 hours,      admit to step-down, check urine drug screen.  Also, check Tylenol      level as the patient has been taking Tylenol at home and does have      liver failure issues.  Check cardiac enzymes given history of chest      pain in this a.m. and altered mental status.  We will also check an      a.m. EKG.  Blood cultures and urine culture pending.  We will check      culture of biliary fluids as well given history of vancomycin-      resistant Enterococcus infections in the past.  Urine not      suspicious for urinary tract infection.  We  will start empiric      antibiotic coverage despite no fever for altered mental status.  We      will start Tygacil.  Given history of vancomycin-resistant      Enterococcus on December 22, 2007, called and discussed with      Infectious Disease.  We will also start fluconazole 400 mg IV daily      since a Candida is growing in biliary fluid during his past      hospitalization.  2. Acute renal failure.  We will recheck a BMET analysis.  Potassium      was high on his past BMET.  Creatinine was 1.53 in clinic on      January 13, 2008.  It is now 3.33.  We will hydrate with D5 normal      saline at 200 mL an hour for 8 hours, then recheck a BMET with a.m.      labs.  This will hopefully help if due to prerenal etiology.  Foley      already in place.  No evidence of obstruction that would cause      postrenal failure.  We will need to watch the patient carefully to      ensure that he does not develop renal syndrome.  3. Hyperkalemia.  Potassium was 4.9 on January 13, 2008, and it is now      5.6.  We will recheck now as this last lab was drawn this morning      given 8 units of NovoLog as well as dextrose in the ED to help      lower his potassium.  We will check in the morning as well.  No      peak T-waves noted on EKG today to indicate EKG changes associated      with hyperkalemia.  4. Alcoholic cirrhosis of the liver.  Cirrhosis is also due to      hepatitis C.  The patient has an appointment with [facility] to discuss      liver transplantation on February 10, 2008.  Talked with GI doctor      and cover, Dr. Logan Bores, he does not feel he will qualify for this.      GI will see tomorrow, but would likely have a consult IR.  I am not      sure if there is anything else that they are able to do for this      patient.  We will increase his lactulose to 60 mL 3 times a day.      He has end-stage liver disease.  We are consulting Palliative Care      as daughter interested in hospice.  Holding Lasix  and      spironolactone while we hydrate the patient and try to resolve      acute renal failure.  5. Abdominal pain is likely due to ascites.  We will give morphine 2      mg IV if awakens and complains of pain.  The patient was on      Percocet at home.  We will probably avoid in future given that it      has Tylenol and the patient has liver failure. 6. Increased drainage of biliary drain.  We will culture biliary duct      drainage.  We will consult Interventional Radiology in the morning.      It could be increased due to blockage at the ampulla that may have      been noted on January 10, 2008, and his last cholangiogram, starting      Tygacil and Diflucan as he had these growing out of his biliary      drain last hospitalization.  7. Chronic obstructive pulmonary disease.  Continue his albuterol and      Flovent.  8. Hypertension.  Blood pressure has been okay except for the last one      with a low diastolic pressure.  We will hold his Lasix and  spironolactone while hydrating.  We will also hold his propranolol.  9. Depression.  The patient has altered mental status and is unable to      take p.o. meds.  We will hold his Remeron.  We will restart Lexapro      if tolerated and Remeron if awakens.  10.Prophylaxis.  The patient was started on Protonix 40 mg IV b.i.d.      Has a history of gastrointestinal bleed and is n.p.o. due to      altered mental status.  We will start sequential compression      devices to lower extremities if patient is Hemoccult negative x1,      but has a history of gastrointestinal bleeding in the past, so we      will not start heparin or Lovenox at this point.   DISPOSITION:  We will admit to step-down due to altered mental status.  The patient is DNR/DNI.  We will get a Palliative Care consult.      Alanda Amass, M.D.  Electronically Signed      Leighton Roach McDiarmid, M.D.  Electronically Signed    JH/MEDQ  D:  01/20/2008  T:   01/20/2008  Job:  [id number]

## 2010-05-24 NOTE — Discharge Summary (Signed)
NAME:  Stephen Rojas, Stephen Rojas NO.:  [id number]   MEDICAL RECORD NO.:  [id number]          PATIENT TYPE:  INP   LOCATION:  5030                         FACILITY:  [facility]   PHYSICIAN:  Lubertha Basque. Dalldorf, M.D.DATE OF BIRTH:  19-Oct-1945   DATE OF ADMISSION:  DATE OF DISCHARGE:  11/05/2003                                 DISCHARGE SUMMARY   ADMISSION DIAGNOSES:  1.  Right knee end-stage DJD.  2.  History of hypertension.  3.  History of alcohol abuse.  4.  History of tobacco abuse.   DISCHARGE DIAGNOSES:  1.  Right knee end-stage degenerative joint disease.  2.  History of hypertension.  3.  History of alcohol abuse.  4.  History of tobacco abuse.  5.  Right total knee replacement.   BRIEF HISTORY:  Stephen Rojas is a 65 year old white male complaining of  longstanding history of right knee pain.  Now he is having pain with every  step and nighttime pain.  He has had multiple corticosteroid injections, he  has tried oral anti-inflammatory medicines.  He is having pain and swelling  to the point that he is uncomfortable pretty much all of the time.  His x-  rays show end-stage DJD of his right knee.  We had discussed treatment  options with the patient; that being total knee replacement, with the risks  of anesthesia, infection, DVT and possible death.   PERTINENT LABORATORY AND X-RAY FINDINGS:  EKG showed sinus bradycardia.  WBC  5.1, hemoglobin 9.9 and on last testing hemoglobin 28.5, platelets 53 (low),  INR 1.7.  Sodium 136, potassium 3.7, glucose 98, BUN 11, creatinine 8.8,  calcium 8.9, albumin 3.4, total protein 8.0.  Noted to be A positive blood  type with antibody screen negative.  Blood was replaced as necessary from  his [employer].   COURSE IN THE HOSPITAL:  The patient was admitted postoperatively. He had a  PCA morphine pump, standing orders, diet to be advanced as tolerated.  IV of  D5 lactated Ringer's at 100 cc/hr, IV Ancef 1 g q.8h. x3 doses, laxative of  choice, Coumadin per pharmacy protocol, Phenergan as an antiemetic.  He was  kept on his home doses of propranolol, Norvasc, HCTZ and hydralazine.   Ice to his knee, Foley catheter to straight drain, bed with an overhead  frame, CPM machine 0-15 hours a day and advance as tolerated. He can use a  knee immobilizer, but discontinue when he can do a straight leg raise. CBC  and BMET follow-up x3 days, x2 days with serial pro times regulating his  Coumadin.  Weightbearing as tolerated.  He was also given a nicotine patch.   The first day in-hospital postoperatively, his blood pressure was 140/67,  heart rate 70, temperature 98, hemoglobin 12.3, INR 1.2.  Lungs were clear,  abdomen was soft. Right knee dressing was okay.  Hemovac had pulled out by  accident by the patient. Good neurovascular status to his toes. Foley  catheter in place.  He was using a CPM machine.  No sign of infection or  irritation.  He was having difficulty stopping smoking so we called in a  nicotine patch for him.  Pharmacy was helping dose low dose Coumadin for DVT  prophylaxis. He could be weightbearing as tolerated with the help of  physical therapy.   The second day postop, he was afebrile, vital signs were stable, dressing  was dry.  He was neurovascularly intact and his dressing was changed.  He  was eating and his catheter was removed. His PCA pump was discontinued as  well.  The third day postop, he was af he was afebrile, vital signs were  stable, dressing was dry.  He was to be discontinued home.   CONDITION ON DISCHARGE:   DISCHARGE INSTRUCTIONS:  [facility] for physical therapy and pro  times.  Diet was unrestricted.  He may change his dressing daily.  He can  return to the office in 1 week at [phone] and call if there are any signs  of infection, same number. He was weightbearing as tolerated.   DISCHARGE MEDICATIONS:  1.  Coumadin 5 mg to take 1 tablet each evening.  2.  Percocet 1-2 p.o.  q.4-6h. for pain.  3.  Propranolol 60 mg 1 a day.  4.  Norvasc 10 mg 1 a day.  5.  HCTZ 25 mg 1 a day.      [place]   MC/MEDQ  D:  12/11/2003  T:  12/11/2003  Job:  [id number]

## 2010-05-24 NOTE — Op Note (Signed)
NAME:  MEHTAB, DOLBERRY NO.:  [id number]   MEDICAL RECORD NO.:  [id number]          PATIENT TYPE:  AMB   LOCATION:  ENDO                         FACILITY:  [facility]   PHYSICIAN:  Shirley Friar, MDDATE OF BIRTH:  [birth year]   DATE OF PROCEDURE:  03/18/2006  DATE OF DISCHARGE:  03/18/2006                               OPERATIVE REPORT   Hepatitis C cirrhosis, history gastric ulcers.   MEDICATIONS:  Fentanyl 12.5 mcg IV, Versed 2 mg IV, additional medicine  given for preceding colonoscopy.   FINDINGS:  Endoscope was inserted into the oropharynx, esophagus was  intubated.  The esophageal lining was normal with no esophageal varices  or erythema.  Endoscope was advanced into the stomach which revealed  scattered serpiginous nodular areas in the antrum with erythema and  superficial ulceration.  Two biopsies were taken of this area.  Retroflexion revealed normal proximal stomach.  Endoscope was  straightened and advanced into the duodenal bulb and second portion of  duodenum which revealed normal-appearing mucosa.  Endoscope was then  withdrawn and confirmed the above findings.   ASSESSMENT:  1. Nodular and erythematous mucosa in the antrum concerning for      gastritis.  2. Antral ulcerations.   PLAN:  1. Follow-up on path.  2. Avoid NSAIDs.  3. Continue PPI therapy twice a day.      Shirley Friar, MD  Electronically Signed     VCS/MEDQ  D:  03/18/2006  T:  03/20/2006  Job:  [id number]   cc:   Sibyl Parr. Darrick Penna, M.D.  Henri Medal, MD

## 2010-05-24 NOTE — Op Note (Signed)
NAME:  Stephen Rojas, Stephen Rojas NO.:  [id number]   MEDICAL RECORD NO.:  [id number]          PATIENT TYPE:  AMB   LOCATION:  ENDO                         FACILITY:  [facility]   PHYSICIAN:  Shirley Friar, MDDATE OF BIRTH:  [birth year]   DATE OF PROCEDURE:  03/18/2006  DATE OF DISCHARGE:  03/18/2006                               OPERATIVE REPORT   INDICATIONS:  Rectal bleeding, history of cirrhosis.   MEDICATIONS:  87.5 mcg IV, 8 mg IV.   FINDINGS:  Rectal exam was revealed large external hemorrhoids.  A adult  colonoscope was inserted to a well prepped colon advanced to the cecum  where ileocecal valve and appendiceal orifice were identified.  On  careful withdrawal of the colonoscope, a 1-cm sessile polyp was noted in  the ascending colon, that was removed with snare cautery.  On further  withdrawal the colonoscope revealed four sessile polyps which ranged  from 2-5 mm that were each removed with snare cautery.  In the sigmoid  colon was a 1 cm pedunculated polyp that was removed with snare cautery.  Retroflexion revealed small internal hemorrhoids.  No rectal varices  were seen.   ASSESSMENT:  1. Multiple polyps status post snare cautery.  2. External internal hemorrhoids (suspect rectal bleeding source from      external hemorrhoids).  3. No rectal varices.      Shirley Friar, MD  Electronically Signed     VCS/MEDQ  D:  03/18/2006  T:  03/20/2006  Job:  [id number]   cc:   Sibyl Parr. Darrick Penna, M.D.  Henri Medal, MD

## 2010-05-24 NOTE — Op Note (Signed)
NAME:  Stephen Rojas, Stephen Rojas NO.:  [id number]   MEDICAL RECORD NO.:  [id number]          PATIENT TYPE:  AMB   LOCATION:  DSC                          FACILITY:  [facility]   PHYSICIAN:  Lubertha Basque. Dalldorf, M.D.DATE OF BIRTH:  1945-02-05   DATE OF PROCEDURE:  01/27/2006  DATE OF DISCHARGE:                               OPERATIVE REPORT   PREOPERATIVE DIAGNOSIS:  Left foot plantar fasciitis.   POSTOPERATIVE DIAGNOSIS:  Left foot plantar fasciitis.   PROCEDURE:  Left foot endoscopic plantar fascial release.   ANESTHESIA:  General.   ATTENDING SURGEON:  Lubertha Basque. Jerl Santos, M.D.   ASSISTANT:  Lindwood Qua, P.A.   INDICATIONS FOR PROCEDURE:  The patient is a 65 year old male with a  long history of left heel pain.  This has persisted despite pads, oral  anti-inflammatories, stretching program, and an injection.  He has pain  which limits his ability to walk; and he is offered operative  intervention.  Informed operative consent was obtained after discussion  of possible complications of reaction to anesthesia, infection, and  neurovascular injury related to an endoscopic plantar fascial release.   SUMMARY FINDINGS AND PROCEDURE:  Under general anesthesia a left heel  endoscopic plantar fascial release was performed.  We utilized the Topaz  device to release and stimulate the fascia.   DESCRIPTION OF PROCEDURE:  The patient was taken to the operating suite  where general anesthetic was applied without difficulty.  He was  positioned supine and prepped and draped in a normal sterile fashion.  After the administration of IV Kefzol, the left leg was elevated,  exsanguinated, and a tourniquet inflated about the calf.  A medial  incision was made at the heel with dissection through to a small lateral  portal.  A trocar was placed which initially was superior to the plantar  fascia.  We then introduced the Topaz machine with appropriate fluid and  stimulated this in 10 or 12  locations.  I then turned the cannula to a  different position where the plantar fascia was inferior to the cannula;  and a similar procedure was done, again, with the Topaz machine.  The  tourniquet was deflated; and a small amount of bleeding was easily  controlled with pressure.  The toes all became pink and warm  immediately.  Adaptic was applied to the wounds after simple sutures of  nylon were placed.  A dry gauze and a loose Ace wrap was then placed.  Estimated blood loss and intraoperative fluids as well as accurate  tourniquet time can obtained from anesthesia records.   DISPOSITION:  The patient was extubated in the operating room; and taken  to recovery room in stable addition.  He was to go home the same-day and  followup in the office next week.  I will contact him by phone tonight.      Lubertha Basque Jerl Santos, M.D.  Electronically Signed     PGD/MEDQ  D:  01/27/2006  T:  01/27/2006  Job:  [id number]

## 2010-05-24 NOTE — Consult Note (Signed)
NAME:  TYMEER, VAQUERA NO.:  [id number]   MEDICAL RECORD NO.:  [id number]                   PATIENT TYPE:  INP   LOCATION:  5504                                 FACILITY:  [facility]   PHYSICIAN:  Jesse Sans. Wall, M.D. [facility]            DATE OF BIRTH:  [birth year]   DATE OF CONSULTATION:  DATE OF DISCHARGE:                                   CONSULTATION   REASON FOR CONSULTATION:  We were asked by Dr. Susann Givens to evaluate the  patient with chest pain and dyspnea.   HISTORY OF PRESENT ILLNESS:  He is a 65 year old gentleman with no known  history of coronary disease.  He has been having increased chest tightness  and dyspnea on exertion over the past two years.   Since Friday he has had one episode of PND, worsening chest pain and  shortness of breath while using cocaine on Friday.  He has also had some  lower extremity edema.   He drinks between 3-24 beers per day.  His last cocaine supposedly was  Friday, though his drug screen was negative.   ALLERGIES:  CODEINE, (intolerant.   MEDICATIONS:  None prior to admission.  Currently he is on baby aspirin 81  mg a day, Protonix 40 a day, nicotine patch, albuterol and Atrovent,  multivitamins and Ativan.   PAST MEDICAL HISTORY:  He has a history of hypertension.  Lipid status is  unknown.   SOCIAL HISTORY:  He lives in [place] alone.  He is a Programmer, multimedia.  He said his last cocaine exposure was 10 years ago.  He is divorced and has  one child.  He does not exercise.   FAMILY HISTORY:  Really noncontributory.   REVIEW OF SYSTEMS:  Unremarkable, as HPI.   PHYSICAL EXAMINATION:  VITAL SIGNS:  Blood pressure 155/73, pulse 96 and  regular, respiratory rate 22, slightly shallow, temperature 98.6, O2  saturation not on the chart.  GENERAL:  He is no acute distress per say.  He is somewhat disheveled  appearing.  He is alert and oriented.  NEUROLOGIC:  Grossly intact.  HEENT:  Unremarkable.  NECK:   No JVD, carotid upstrokes are equal bilaterally with soft bruits  bilaterally.  He has no obvious lymphadenopathy.  There is no thyromegaly.  LUNGS:  Expiratory rhonchi.  CARDIAC:  Nondisplaced PMI with normal S1, S2 without gallop.  Very faint  systolic murmur at the left upper sternal border.  ABDOMEN:  Obese with good bowel sounds.  His liver down 2-3 cm right costal  margin.  EXTREMITIES:  Reveal 1-2+ pretibial edema.  Pulses were present, but  reduced.  He has got chronic venous arterial insufficiency changes.   LABORATORY DATA:  EKG shows sinus rhythm with ST segment depression,  laterally.  Laboratory data at this point is remarkable for slightly  elevated LFT's, including a bilirubin of 1.9.  CPK, MB  and troponin's were  negative x2.  Lipid panel is pending.  His platelet count is down 67,000.  He is not anemic at 15.6.   ASSESSMENT:  1. Chest tightness and shortness of breath.  It is hard to differentiate     whether this is cardiac or pulmonary, or both.  We will obtain a stress     Cardiolite as well as a 2D echocardiogram in the hospital tomorrow.  2. Lower extremity edema.  Begin Lasix with his hypertension.  3. Hypertension.  Begin ACE inhibitor and diuretics.  4. Multiple risk factors.  Need to encourage to limit smoking and to check     lipids, which are ordered.  5. Avoid beta blocker with cocaine exposure.  This will give unopposed alpha     stimulation.  If we have to use beta blocker Carbetalol will be the best     since it is an alpha blocker as well.                                               Thomas C. Daleen Squibb, M.D. [facility]    TCW/MEDQ  D:  02/20/2002  T:  02/20/2002  Job:  [id number]   cc:   Kevin Fenton, M.D.  [facility]  [place]  [place] [address]  Fax: [phone]

## 2010-05-24 NOTE — Op Note (Signed)
NAME:  Stephen Rojas, Stephen Rojas NO.:  [id number]   MEDICAL RECORD NO.:  [id number]          PATIENT TYPE:  INP   LOCATION:  2550                         FACILITY:  [facility]   PHYSICIAN:  Lubertha Basque. Dalldorf, M.D.DATE OF BIRTH:  1945-04-12   DATE OF PROCEDURE:  11/02/2003  DATE OF DISCHARGE:                                 OPERATIVE REPORT   PREOPERATIVE DIAGNOSES:  Right knee degenerative arthritis.   POSTOPERATIVE DIAGNOSES:  Right knee degenerative arthritis.   OPERATION PERFORMED:  Right total knee replacement.   SURGEON:  Lubertha Basque. Jerl Santos, M.D.   ASSISTANT:  1.  Charlesetta Shanks, M.D.   ANESTHESIA:  General.   INDICATIONS FOR PROCEDURE:  The patient is a 65 year old man with a long  history of a painful right knee.  He is status post an open meniscectomy  many years ago.  He has failed various oral anti-inflammatories and  injections and has disabling pain with activity and pain which keeps him  from resting at night.  On x-ray he has end stage degeneration.  He has some  concomitant medical conditions including hepatitis C as well as  thrombocytopenia.  He was offered a knee replacement operation.  Informed  operative consent was obtained after discussion of possible complications of  reaction to anesthesia, infection, DVT, PE, bleeding and death.  He was  instructed that he was at slightly increased risk due to his concomitant  medical conditions but he accepted this and wished to go forward.   DESCRIPTION OF PROCEDURE:  The patient was taken to the operating suite  where general anesthetic was applied without difficulty.  The patient was  positioned supine and prepped and draped in the normal sterile fashion.  After administration of preoperative intravenous antibiotics, the right leg  was elevated, exsanguinated, tourniquet inflated about the thigh.  A  longitudinal anterior incision was made with dissection down to the extensor  mechanism.  All appropriate  anti-infective measures were used including  Betadine impregnated drape, preoperative IV Kefzol and closed hooded exhaust  systems for each member of the surgical team.  A medial parapatellar  incision was made.  The knee cap was flipped and the knee flexed.  He had  severe degenerative change in the medial compartment and moderate change  patellofemoral.  His bone quality was excellent.  I placed an extramedullary  guide to create a tibial cut parallel with the floor with a slight posterior  tilt.  An intramedullary guide was then placed in the femur in order to  create anterior and posterior cuts leaving a flexion gap of about 10 mm.  A  second intramedullary guide was placed in the femur in order to make a  distal cut creating an equal extension gap of 10 mm.  The knee was basically  balanced without any soft tissue releases.  The femur sized to a large  component while the tibial was a 5 and appropriate guides were placed and  utilized.  The patella was cut down in thickness by about 10 mm and sized to  a 35 where the appropriate guide  was placed and utilized as well.  Trial  reduction was done with these components and a 10 mm spacer.  Easily came to  full extension and flexed well.  The knee cap tracked well.  Trial  components were removed and pulsatile lavage was used to cleans all cut bony  surfaces.  Cement was mixed including antibiotic and was pressurized onto  the three bones.  The aforementioned Depuy LCS components were placed and  they again were large femur, 5 tibia, 35 mm all polyethylene patella, and 10  mm deep dish spacer.  Excess cement was trimmed and pressure was held on the  components until the cement had hardened.  The knee came to full extension  and flexed well again with the knee cap tracking in a normal position.  The  patient was administered some platelets towards closure and about 15 minutes  after infusion began with release of the tourniquet.  A small  amount of  bleeding was easily controlled with Bovie cautery.  The knee was thoroughly  irrigated followed by placement of a drain exiting superolaterally.  We then  reapproximated the extensor mechanism in interrupted fashion using #1  Vicryl.  Subcutaneous tissue was reapproximated with 0 and 2-0 undyed Vicryl  followed by skin closure with staples.  Adaptic was applied to the wound  after some local Marcaine was injected.  A dry gauze dressing was applied  with a loose Ace wrap and a knee immobilizer.  It should be noted that at  the end of the case, he could flex to about 120 degrees passively against  gravity.  Estimated blood loss and intraoperative fluids as well as accurate  tourniquet time can be obtained from anesthesia records.   DISPOSITION:  The patient was extubated in the operating room and taken to  the recovery room in stable condition.  Plans were for the patient to be  admitted to the orthopedic surgery service for the appropriate postoperative  care to include perioperative antibiotic and Coumadin for DVT prophylaxis.  We will obviously avoid any sort of heparin with this thrombocytopenia and  will monitor his platelets closely.       PGD/MEDQ  D:  11/02/2003  T:  11/02/2003  Job:  [id number]

## 2010-05-24 NOTE — Discharge Summary (Signed)
NAME:  Stephen Rojas, HELD NO.:  [id number]   MEDICAL RECORD NO.:  [id number]          PATIENT TYPE:  INP   LOCATION:  2032                         FACILITY:  [facility]   PHYSICIAN:  Leighton Roach McDiarmid, M.D.DATE OF BIRTH:  1945-02-07   DATE OF ADMISSION:  04/06/2007  DATE OF DISCHARGE:  04/16/2007                               DISCHARGE SUMMARY   PRIMARY CARE Ulanda Tackett:  Angeline Slim, MD, [facility].   CONSULTANTS:  Rollene Rotunda, MD, [facility], Cardiology.   PROCEDURE:  Adenosine Myoview on April 14, 2007 without any evidence of  ischemia and normal left ventricular function.   REASON FOR ADMISSION:  Altered mental status, weakness, and chest pain.   DISCHARGE DIAGNOSES:  1. Alcohol withdrawal.  2. Alcohol abuse.  3. Cirrhosis.  4. Hepatitis C.  5. Chronic obstructive pulmonary disease.  6. Hypertension.  7. Claudication.  8. Non ST Elevation Myocardial Infarction  9. Thrombocytopenia secondary to cirrhosis  10.Gastroesophageal reflux disease   DISCHARGE MEDICATIONS:  1. Spironolactone 25 mg po daily  2. Protonix 40 mg po daily  3. Spiriva 18 mcg inhaled daily  4. Lexapro 10 mg po daily  5. Lactulose 45 ml four times a day  6. Thiamine 100 mg po daily  7. Folic acid 1 mg po daily  8. Multivitamin daily  9. Lasix 20 mg po daily  10.Lisinopril 20 mg po daily  11.Propanolol 60 mg po daily  12.Flovent 220 mcg- 2 puffs inhaled BID  13.Albuterol 90 mcg- 2 puffs inhaled Q4-Q6 hours PRN      wheezing/Shortness of breath   HOSPITAL COURSE:  1. Altered mental status: Patient presented with increased confusion      for over a week. This was likely secondary to alcohol withdrawal      and hepatic encephalopathy. Patient was initially very aggressive      and combative and confused during his hospitalization, requiring      monitoring in the stepdown unit. He was treated with lactulose for      hepatic encephalopathy and placed on the full dose ativan  protocol      for alcohol withdrawal. Patient exhibited hallucinations and      confusion. As the ativan was decreased and then discontinued, his      mental status improved, and it is likely that benzodiazpine      intoxication contributed to some of his delirium while in the      hospital given his decreased liver function. Prior to discharge,      however, patient's mental status had improved to baseline per his      family. He was encouraged to stop drinking and the importance of      taking his lactulose as prescribed was heavily stressed to him. He      declined the assistance of social work for resources for [employer].  2. Non ST Elevation Myocardial Infarction: While in stepdown, patient      had an episode of hypoxia which required BIPAP. Subsequently, his      cardiac markers became elevated. Cardiology was consulted,  and did      not recommend any immediate interventions with the exception of      managing patient's blood pressure and having patient undergo stress      myoview prior to discharge. Patient did not have any EKG changes,      and it was determined that patient likely had type 2 myocardial      infarction secondary to demand mismatch and stress. His adenosine      myoview was normal with an ejection fraction of 71%. He will need      follow up with cardiology as an outpatient.  3. Chronic Obstructive Pulmonary Disease: patient did have episode of      hypoxia early in hospitalization requiring BIPAP. Subsequent to      that, he did not have an oxygen requirement and his oxygen      saturations were stable on room air. He was continued on his home      inhaled medications and had flovent dose increased.  4. Cirrhosis: This is likely secondary to alcohol abuse as well as      history of Hepatitis C. Patient with likely hepatic encephalopathy      on admission. His mental status improved with administration of      lactulose four times a day. Patient was also continued  on Lasix and      spironolactone. He was continued on propanolol for prophylaxis of      variceal bleeding. Patient was briefly placed on neomycin and      flagyl for hepatic encephalopathy as well.  5. Alcohol abuse: patient was placed on protonix 40 mg twice a day,      and received folic acid, thiamine, and a multivitamin daily. He was      initially on the full dose ativan protocol, and then had this      tapered and discontinued secondary to benzodiazipine intoxication.      Patient declined assistance of social work for resources. Was      strongly encouraged during hospitalization and at discharge to stop      drinking.  6. Hypertension: On admission, it seemed patient was only on      propanolol and not any other antihypertensive agents. As his mental      status cleared, it seemed that there was some confusion regarding      his medications given that some were prescribed by his primary care      Delinda Malan and others were prescribed by his cardiologist. He was      started on Lisinopril  for his blood pressure, and had a good      response while in the hospital. will likely need titration of      medication as an outpatient. His medication list was updated in his      electronic medical record, and he and his daughter were encouraged      to throw away any old medications to prevent confusion.  7. Thrombocytopenia: patient's platelet count improved and normalized      during his hospitalization and should be monitored as an      outpatient.   CONDITION AT TIME OF DISCHARGE:  Improved   DISCHARGE LABORATORY VALUES:  Creatinine 0.86, total bilirubin 0.9,  alkaline phosphatase 79, AST 79, ALT 53, Albumin 3.0, Hemoglobin 13.8,  Platelets 170,000, ammonia 33   DISPOSITION:  Patient is discharged to home   FOLLOW UP:  Patient is follow up at [facility] with Dr.  Angeline Slim on April 21, 2007 at 2:15 P.M.   FOLLOW UP ISSUES:  1. Alcohol abuse  2. May need  adjustment of antihypertensive medications  3. Will need cardiology follow up  4. Monitoring of platelets      Lauro Franklin, MD  Electronically Signed      Leighton Roach McDiarmid, M.D.  Electronically Signed    TCB/MEDQ  D:  04/30/2007  T:  05/01/2007  Job:  [id number]

## 2010-05-24 NOTE — Assessment & Plan Note (Signed)
[facility]                              CARDIOLOGY OFFICE NOTE   Stephen Rojas, Stephen Rojas                        MRN:          [id number]  DATE:10/07/2005                            DOB:          1945-09-16    This is a patient of Dr. Jenene Slicker.  Patient is in for further evaluation  of possible cardiac catheterization.  This is a 65 year old white male  patient who saw Dr. Antoine Poche with chest discomfort and dyspnea back in  August, and because of his cardiac risk factors, cardiac cath was  recommended.  When his labs came back, he was found to have thrombocytopenia  with platelets of 71,000.  Cath was put on hold and he was seen in consult  by Dr. Clelia Croft.  He felt the patient had longstanding thrombocytopenia dating  back to 2000.  It was related to sequestration of his platelets and his  spleen secondary to portal hypertension.  He felt other etiologies could be  myelosuppression from chronic alcohol intake could be playing a role as  well.  He felt he could proceed with the cardiac catheterization safely with  a platelet count between 80 and 90,000, and if it needs to be close to  100,000, transfusion of a 6-pack of platelets leading up to the catheter the  day before should be adequate and reasonable.  The patient called and said  his chest pain is actually improved, and is here for further evaluation of  whether or not he needs a cath.   The patient states he has sharp, shooting, stabbing, knife-like chest pains  mostly at rest.  He also has an occasional band around his chest, associated  with some shortness of breath that wakes him up from sleep, and eases when  he sits up.  He had some of that with exercising but has increased his  exercise, walking a half a mile a day, and is not having exertional chest  tightness in over 3 weeks.  At this point, he would rather forego cardiac  catheterization at this point, and proceed with a stress test.   CURRENT MEDICATIONS:  1. Hydrochlorothiazide 25 mg daily.  2. Norvasc 10 mg daily.  3. Hydralazine 25 b.i.d.  4. Propranolol 60 mg daily.  5. Enalapril 10 mg daily.   PHYSICAL EXAMINATION:  GENERAL:  This is a pleasant 65 year old white male  in no acute distress.  VITAL SIGNS:  Blood pressure 145/77, pulse 73, weight 242.  NECK:  Without JVD, HJR, bruit, or thyroid enlargement.  LUNGS:  Clear anterior and posterolateral.  HEART:  Regular rate and rhythm at 73 beats per minute.  Normal S1 and S2.  Positive S4.  No murmur, rub, bruit, thrill, or heave noted.  ABDOMEN:  Obese.  Normoactive bowel sounds are heard throughout.  EXTREMITIES:  Without cyanosis, clubbing, edema.  He has good distal pulses.   IMPRESSION:  1. Chest pain and dyspnea.  Question etiology.  We will proceed with      adenosine Cardiolite.  2. Thrombocytopenia, probably longstanding, secondary to portal  hypertension and chronic alcohol intake.  Would require platelet      transfusion prior to cardiac catheterization if needed.  3. Obesity.  4. Hypertension.  5. Status post right knee replacement.  6. History of cirrhosis and hepatitis C.   PLAN:  At this time, we will order an adenosine Cardiolite, as the patient  prefers to not proceed with cardiac catheterization at this time due to his  low platelet count.  I will order this and have him see Dr. Antoine Poche back in  followup.      ______________________________  Jacolyn Reedy, PA-C    ______________________________  E. Graceann Congress, MD, [facility]    ML/MedQ  DD:  10/07/2005  DT:  10/08/2005  Job #:  [id number]

## 2010-05-24 NOTE — H&P (Signed)
NAME:  Stephen Rojas, Stephen Rojas NO.:  [id number]   MEDICAL RECORD NO.:  [id number]                   PATIENT TYPE:  INP   LOCATION:  1823                                 FACILITY:  [facility]   PHYSICIAN:  Pearlean Brownie, M.D.            DATE OF BIRTH:  October 22, 1945   DATE OF ADMISSION:  02/19/2002  DATE OF DISCHARGE:                                HISTORY & PHYSICAL   CHIEF COMPLAINT:  Chest pain.   Admitted to [facility].  Primary physician unassigned.   HISTORY OF PRESENT ILLNESS:  The patient is a 65 year old white male that  describes sitting at a bar tonight and feeling like he was hit in the chest  with a hammer.  The pain was in his left upper chest, and his right arm  went numb.  He described shortness of breath, nausea, but no vomiting.  He  had been drinking straight for the past 24 hours.  He denied any  diaphoresis.  He does endorse palpitations.  He says the chest pain lasts  approximately 4-5 minutes.  He sat in the bar for approximately 30 minutes,  and then came to the emergency room.  He continued with mild pain until he  reached the hospital.  The pain was relieved mostly with sublingual  nitroglycerin.  He has never had this pain before.  The pain was gone in the  emergency room upon our interview.  He was on heparin and nitroglycerin drip  at that time.  He does report using cocaine yesterday.   CARDIAC RISK FACTORS:  Positive tobacco, positive male, positive age.  Unknown hypertension.  Unknown diabetes.  Positive family history.  Unknown  cholesterol.  Positive drug use.  Positive obesity.   PAST MEDICAL HISTORY:  1. History of heroin overdose approximately two years ago.  The patient does     have a history of sharing needles.  2. Tobacco abuse.  3. Alcohol abuse.  4. Status post right knee surgery.  5. Polysubstance abuse.   MEDICATIONS:  Aspirin 325 mg one tablet p.o. every day   ALLERGIES:  Codeine which  causes swelling.   SOCIAL HISTORY:  The patient is single, divorced x3.  He lives alone.  He  works in home improvement.  He uses alcohol daily, specifically beer, 12-18  beers daily.  He does report occasional alcohol withdrawal.  No seizures  however.  Positive tobacco use, two packs per day x40 years, remote IV drug  use.  Positive cocaine use occasionally, last use yesterday.  He was in the  penitentiary for 9 years.   FAMILY HISTORY:  Mother deceased.  Unsure.  Father deceased.  Hypertension,  cirrhosis, lung cancer.  Three brothers and one died of an MI at 53 years  old.  One sister.   REVIEW OF SYSTEMS:  No fevers, chills, nausea or vomiting.  No change in his  bowel habits.  He does report bright red blood per rectum.  No abdominal  pain.  No dysuria.  No memory loss.  No weight loss.  Positive shortness of  breath and chest pain as above.  Positive hemoptysis.  Positive lower  extremity edema.  Positive orthopnea.   PHYSICAL EXAMINATION:  VITAL SIGNS:  Temperature 98, pulse 80, respirations  22, blood pressure 133/75, saturations 96% on room air.  GENERAL:  This is an alert and oriented 65 year old white male who appears  older than stated age.  HEENT:  Normocephalic, atraumatic.  Pupils equal, round and reactive to  light and accommodations.  Extraocular movements are intact.  Bilaterally  muddy sclerae.  Nonicteric.  Nares patent.  Deviated septum.  TM's clear.  Oropharynx moist mucous membranes.  Upper and lower dentures present.  Positive telangectasias on face.  NECK:  Supple, no bruits.  HEART:  Regular rate and rhythm without murmurs, rubs or gallops.  No ectopy  is appreciated.  LUNGS:  Diffuse wheezing with coarse rhonchi throughout, prolonged  expiratory phase noted.  Good respiratory effort.  ABDOMEN:  Distended, soft, no hepatosplenomegaly.  Decreased bowel sounds  throughout.  No stria present.  CHEST:  Positive gynecomastia.  EXTREMITIES:  There is 2+  pitting edema to mid shin.  Hyperpigmented,  consistent with chronic venous stasis changes.  SKIN:  Tattoos throughout upper extremities.  NEUROLOGIC:  Cranial nerves II-XII are grossly intact.  No asterixis noted.  GENITOURINARY:  Normal male.  RECTAL:  Good rectal tones, symmetrical, slightly tender, non-enlarged  prostate, positive hemorrhoids present.  Heme negative.   LABORATORY DATA:  Hemoglobin 16, hematocrit 45.4, white blood cell count  10.1, MCV 94.3, ANC 4.0.  Urine drug screen positive for cocaine.   EKG:  Normal sinus rhythm, 81 beats per minute.  Nonspecific T wave changes  in leads 1, aVL, 5 and 6.  Chest x-ray, no acute disease.   Cardiac enzymes, complete metabolic panel pending.   ASSESSMENT AND PLAN:  73. A 65 year old white male who presents to the emergency department with     chest pain, admitted for further workup.  Admit to [facility], Dr. Deirdre Priest attending.  2. Chest pain.  The differential includes cardiac with acute coronary     syndrome versus dissection, versus esophageal varices/ Mallory-Weiss tear     versus pulmonary embolus.  We will check serial cardiac enzymes, TSH,     fasting lipid panel for cardiac risk stratification.  Continue with     heparin and nitroglycerin drips.  Need to continue aspirin.  The patient     has significant cardiac risk factors.  3. Alcohol abuse, worrisome for cirrhosis secondary to alcohol.  We will     give banana bag for IV fluids, multivitamin, thiamine and folate.  Check     B12 and folate.  Ativan for withdrawal.  Check hepatitis panel secondary     to IV drug use, and history of tattoos.  4. Bright red blood per rectum likely secondary to hemorrhoids.  The patient     has significant risk factors for colon cancer and GI bleed, guaiac     stools, protective PPI, and colonoscopy as an outpatient. 5. Tobacco abuse.  Nicotine patch.  Current cessation.  Likely chronic     obstructive pulmonary  disease.  Will support with Atrovent.  6. Drug abuse.  Check for hepatitis and encourage outpatient treatment and     social work consultation  to assist with this.     Pricilla Holm, M.D.                  Pearlean Brownie, M.D.    SR/MEDQ  D:  02/19/2002  T:  02/20/2002  Job:  [id number]

## 2010-05-24 NOTE — Consult Note (Signed)
NAME:  KIARA, KEEP NO.:  [id number]   MEDICAL RECORD NO.:  [id number]         PATIENT TYPE:  COIB   LOCATION:                               FACILITY:  [facility]   PHYSICIAN:  Rollene Rotunda, MD, FACCDATE OF BIRTH:  10-Aug-1945   DATE OF CONSULTATION:  DATE OF DISCHARGE:                                   CONSULTATION   PRIMARY:  [facility]   REASON FOR REFERRAL:  Evaluate patient with shortness of breath and chest  pain.   HISTORY OF PRESENT ILLNESS:  The patient is a 65 year old gentleman who I  have seen in the past for chest pain and dyspnea.  He was not scheduled to  come back to this clinic and is now referred back because of continued chest  pain.  When I saw him last year I did a stress perfusion study with no  evidence of ischemia or infarct.  He did have an echocardiogram as well  which demonstrated no significant abnormalities.  However, over the past  year he has had more chest discomfort.  He said it happens sporadically.  It  happens two to three times a week.  He describes substernal sharp discomfort  that can be 7/10 in intensity.  It radiates to his right arm.  He gets short  of breath and nauseated with this.  It lasts from two to 10 minutes.  It  goes away spontaneously.  It is similar to the pain he has had previously.  He is very limited in his activities.  He says he has progressive dyspnea.  He is also limited by right knee pain.  He gets dyspneic with moderate  activities such as vacuuming.  He does not bring on the chest pain with  this.  He has had increase in lower extremity swelling.   PAST MEDICAL HISTORY:  1. Hypertension x3 years.  2. Cirrhosis.  3. Hepatitis C.  4. He has no history of diabetes and does not know his cholesterol status.   PAST SURGICAL HISTORY:  1. Knee replacement.  2. Tonsillectomy.  3. Jaw surgery.   ALLERGIES:  CODEINE.   MEDICATIONS:  1. Hydrochlorothiazide 25 mg a day.  2. Norvasc 10  mg a day.  3. Hydralazine 25 mg b.i.d.  4. Propranolol 60 mg daily.  5. Enalapril 10 mg daily.   SOCIAL HISTORY:  The patient is disabled.  He is single.  He has one child.  He was smoking cigarettes.  He drinks six to eight beers a day.   FAMILY HISTORY:  Noncontributory for early coronary artery disease.   REVIEW OF SYSTEMS:  As stated in the HPI and otherwise negative for other  systems.   PHYSICAL EXAMINATION:  GENERAL:  Patient is in no distress.  VITAL SIGNS:  Blood pressure 125/71, heart rate 57 and regular.  Weight 238  pounds, body mass index 36.  HEENT:  Eyelids unremarkable.  Pupils are equal, round, and reactive to  light.  Fundi not visualized.  Oral mucosa unremarkable.  NECK:  No jugular venous distention, 45 degrees.  Carotid upstroke brisk and  symmetric.  No bruits.  No thyromegaly.  LYMPHATICS:  No cervical, axillary, inguinal adenopathy.  LUNGS:  Clear to auscultation bilaterally.  BACK:  No costovertebral angle tenderness.  CHEST:  Unremarkable.  HEART:  PMI not displaced or sustained.  S1 and S2 within normal limits.  No  S3.  No S4.  No murmurs.  ABDOMEN:  Positive bowel sounds.  Normal in frequency and pitch.  No bruits,  rebound, guarding.  No midline pulsatile mass.  No hepatomegaly,  splenomegaly.  SKIN:  No rashes.  No nodules.  EXTREMITIES:  2+ pulses.  Moderate bilateral lower extremity edema.  No  cyanosis, clubbing.  NEUROLOGIC:  Oriented to person, place, time.  Cranial nerves II-XII grossly  intact.  Motor grossly intact.   EKG:  Sinus bradycardia, rate 57, axis within normal limits, intervals  within normal limits.  No acute ST-wave changes.   ASSESSMENT AND PLAN:  1. Dyspnea and chest discomfort.  The patient's complaints are      progressive.  He does have significant lower extremity swelling.  He      has multiple cardiovascular risk factors.  At this point I think there      is a pre test probability of coronary disease that justifies  cardiac      catheterization.  At that time I would also be able to measure right      heart pressures to evaluate his dyspnea.  We discussed the risks and      benefits to include death, stroke, vascular trauma, bleeding, bruising,      renal insufficiency, contrast reaction, infection.  The patient      understands and agrees to proceed.  2. Obesity.  He understands the need to lose weight with diet and      exercise.  3. Hypertension.  Blood pressure is well controlled and have obtained to      medications as listed.           ______________________________  Rollene Rotunda, MD, [facility]     JH/MEDQ  D:  08/21/2005  T:  08/21/2005  Job:  [id number]

## 2010-05-24 NOTE — Assessment & Plan Note (Signed)
[facility]                              CARDIOLOGY OFFICE NOTE   Stephen Rojas, Stephen Rojas                        MRN:          [id number]  DATE:10/30/2005                            DOB:          1945-07-01    PRIMARY:  [facility]   REASON FOR PRESENTATION:  Patient with shortness of breath.   HISTORY OF PRESENT ILLNESS:  Patient returns for followup of the above.  I  had initially planned on a cardiac catheterization in this gentleman because  of his risk factors.  However, he had low platelets.  I sent him to see the  hematology/oncologist who thought this might be related to portal  sequestration, maybe related to his chronic alcohol use.  It is being  followed conservatively.  However, we deferred catheterization, instead did  a stress perfusion study.  This demonstrated an EF of 59%. There was normal  LV function, no evidence of ischemia or scar.   The patient still has dyspnea.  This can happen when he lies down at night  or when he tries to do anything.  It seems to be at baseline.  He  occasionally does have to sleep up on pillows.  He has some mild lower  extremity swelling.  He is not having any further chest discomfort,  neck  discomfort, arm discomfort with activity.  No nausea, vomiting, excessive  diaphoresis.  There is no new palpitation, presyncope or syncope.   PAST MEDICAL HISTORY:  Hypertension, cirrhosis, hepatitis C, knee  replacement, tonsillectomy, and jaw surgery.   ALLERGIES:  CODEINE.   CURRENT MEDICATIONS:  1. Hydrochlorothiazide 25 mg daily.  2. Norvasc 10 mg.  3. Hydralazine 25 mg b.i.d.  4. Propranolol 60 mg daily.  5. Enalapril 10 mg daily.   REVIEW OF SYSTEMS:  As stated in the HPI.  Otherwise negative for other  systems.   PHYSICAL EXAMINATION:  Patient in no distress.  Blood pressure 147/80.  Heart rate 69 and regular.  NECK:  No jugular venous distention 45 degrees. Carotid upstroke  brisk,  symmetric.  No bruits, thyromegaly.  LYMPHATICS:  No adenopathy.  LUNGS:  Clear to auscultation bilaterally.  BACK:  No costovertebral angle tenderness.  CHEST:  Unremarkable.  HEART:  PMI not displaced or sustained, S1 and S2 within normal limits.  No  S3, no S4, no murmurs.  ABDOMEN:  Morbidly obese, positive bowel sounds.  Normal in frequency,  pitch.  No bruits, no rebound, no guarding, no midline pulse.  No  hepatosplenomegaly.  SKIN:  No rashes.  EXTREMITIES:  2+ pulse, trace edema.   ASSESSMENT AND PLAN:  1. Dyspnea.  The patient's dyspnea may be related to his ongoing tobacco      abuse and some undiagnosed lung disease.  I am going to get a BMP      level.  This was very mildly elevated in the past.  If it is still      elevated I might start with some diuretics.  But in addition, I think      he needs  to go back to his primary care team to think about a pulmonary      workup.  He knows he needs to stop smoking but he cannot do this.  2. Hypertension.  Blood pressure is mildly elevated.  This is not usual      for him.  I am not going to change his medicines now.  He needs to lose      weight.  3. Followup.  I will see him back in 6 months or sooner if needed.    ______________________________  Rollene Rotunda, MD, [facility]    JH/MedQ  DD: 10/30/2005  DT: 10/31/2005  Job #: [id number]

## 2010-05-24 NOTE — Discharge Summary (Signed)
NAME:  Stephen Rojas, Stephen Rojas NO.:  [id number]   MEDICAL RECORD NO.:  [id number]                   PATIENT TYPE:  INP   LOCATION:  5504                                 FACILITY:  [facility]   PHYSICIAN:  Pearlean Brownie, M.D.            DATE OF BIRTH:  [birth year]   DATE OF ADMISSION:  02/19/2002  DATE OF DISCHARGE:  02/22/2002                                 DISCHARGE SUMMARY   PRIMARY CARE PHYSICIAN:  None.   DISCHARGE DIAGNOSES:  1. Chest pain, rule out myocardial infarction.  2. Polysubstance abuse.  3. Hepatitis C.  4. Hypertension.   DISCHARGE MEDICATIONS:  1. Aspirin 81 mg p.o. daily.  2. Multivitamin one daily.  3. Hydrochlorothiazide 12.5 mg daily.  4. Ativan taper; 2 mg b.i.d. on the day of discharge, then 1 mg b.i.d. for     one day, then 1 mg daily for one day, and then stop.   DISCHARGE INSTRUCTIONS:  The patient was instructed to eat a low fat, low  salt, low cholesterol diet. He was advised to quit tobacco, alcohol, and  cocaine use. He was advised to use Senna or milk of magnesia for  constipation and was also advised to drink plenty of fluids including prune  juice for this as well.   Follow up with Dr. Altamease Oiler C. Merilynn Finland at the [facility] on Wednesday, 3/10 at 10:05 a.m.   CONSULTANTS:  [facility].   PROCEDURE:  1. Echocardiogram 02/21/02 shows left ventricular ejection fraction 55 to     65%. Mild calcification of the aortic valve, mild mitral valve annular     calcification, mildly thickened left ventricular wall.  2. Cardiolite 02/21/02 showed ejection fraction of 57%, no ischemia.   NOTE TO THE PATIENT'S PRIMARY DOCTOR:  1. The patient needs follow up for liver function given hepatitis C     diagnosis.  2. Avoid beta blockers in this patient given cocaine use. Carvedilol was     recommended as a possibly as needed, given that this is an alpha blocker     as well.  3. The patient needs a  colonoscopy as an outpatient given his report of     bright red blood per rectum at home which was felt to be due to     hemorrhoids.  4. Thyroid labs pending at discharge.   HOSPITAL COURSE:  Mr. Schlink is a 65 year old white male with no primary  doctor who describes onset of chest pain in his left chest and right arm  associated with shortness of breath and nausea on the day of admission. He  has known history of tobacco, alcohol, drug use including cocaine. Last  episode of cocaine use was one day prior to admission.   Problem 1. Chest pain. The patient admitted to telemetry. Cardiac enzymes  were negative x3 sets. EKG showed nonspecific ST changes, chest x-ray on  admission with no acute disease, no evidence of CHF. The patient was  initially started on a heparin and nitroglycerin drip. An echocardiogram was  done and was overall normal as above. A Cardiolite was also performed which  showed no ischemia and normal LV function as above. The echocardiogram did  show mildly thickened LV wall. A TSH level was mildly elevated at 5.520,  free T3 and free T4 pending at the time of discharge. Fasting lipid panel  showed cholesterol of 118, triglycerides 145, HDL 35, LDL 54; no treatment  needed. The patient was chest pain free during the later part of admission  and on the day of discharge. He will go home on an aspirin. No further  cardiac workup was recommended by the cardiology consult.   Problem 2. Polysubstance abuse. Resources for outpatient rehab were provided  by case management during  hospitalization ([facility] The patient was counseled for cessation. He was treated  with scheduled Ativan 2 mg q.6h. during hospitalization and will be  discharged on a short taper as above, although he will likely simply began  drinking again as an outpatient. Folate and B12 were both normal during  hospitalization.   Problem 3. Hepatitis C. Hepatitis panel  during hospitalization was negative  for hepatitis A and B but positive for hepatitis C antibiotics. Of note, a  HIV test during admission was negative. The patient was aware of the  diagnosis at discharge, to be followed as an outpatient. He will be a very  unlikely candidate for any kind of treatment. Coags during admission showed  a slightly elevated PT at 15.7 and INR of 1.3. Other liver function tests  during admission showed total bilirubin rising from 1.9 to 2.6 during  admission. AST and ALT on admission both elevated at 55 and 47 respectively,  down to 45 and 37 respectively the day prior to discharge. Platelets were  also low, see below. Albumin was also low at 2.9.   Problem 4. Hypertension. The patient's blood pressures ran in the 140s to  160s over 60s to 70s during admission. Hydrochlorothiazide will be started  as an outpatient.   Problem 5. Thrombocytopenia. Platelets 70 on admission, down to 49 on the  day of discharge, likely secondary to chronic alcohol abuse. Watch as an  outpatient.   Problem 6. Tobacco abuse. Of note, albuterol and Atrovent nebulizers were  tried during hospitalization, but the patient denied any relief from these.  O2 saturations in the mid 90s on room air throughout hospitalization.   Problem 7. Bright red blood per rectum. The patient did report bright red  blood per rectum and some hemoptysis on admission, but rectal exam was heme  negative with hemorrhoids. The patient was treated with a PPI during  admission. No stools available to guaiac during hospitalization. Hemoglobin  did drop from 16 to 13.6 over admission, but this was with hydration. The  patient needs colonoscopy as an outpatient.     Noelle C. Merilynn Finland, M.D.                 Pearlean Brownie, M.D.    NCR/MEDQ  D:  02/22/2002  T:  02/22/2002  Job:  [id number]   cc:   Altamease Oiler C. Merilynn Finland, M.D.  Fam. Med - Resident - [place], [place] [address]  Fax: [phone]

## 2010-05-24 NOTE — Op Note (Signed)
   NAME:  Stephen Rojas, Stephen Rojas NO.:  [id number]   MEDICAL RECORD NO.:  [id number]                   PATIENT TYPE:  AMB   LOCATION:  ENDO                                 FACILITY:  [facility]   PHYSICIAN:  Graylin Shiver, M.D.                DATE OF BIRTH:  1945-07-29   DATE OF PROCEDURE:  05/20/2002  DATE OF DISCHARGE:                                 OPERATIVE REPORT   PROCEDURE:  Esophagogastroduodenoscopy.   INDICATIONS FOR PROCEDURE:  This patient has a history of hepatitis C and  probable cirrhosis of the liver.  The endoscopy is being done to determine  primarily if he has esophageal varices and if so, he would be placed on  prophylactic therapy for them.  Informed consent was obtained.   PREMEDICATIONS:  Fentanyl 75 mcg IV, Versed 7 mg IV.   DESCRIPTION OF PROCEDURE:  With the patient in the left lateral decubitus  position, the Olympus gastroscope was inserted into the oropharynx and  passed into the esophagus.  It was advanced down the esophagus and into the  stomach and into the duodenum.  The second portion and bulb of the duodenum  looked normal.  The stomach showed a diffuse, moderate to marked  erythematous appearance to the mucosa compatible with gastritis.  In the  antrum, there were three small antral ulcers.  Two were surrounded by the  pyloric area and the other was slightly more superior up in the antrum.  No  active bleeding was noted.  The scope was retroflexed and the fundus and  cardia also showed gastritis.  No evidence of gastric varices was seen.  The  scope was then straightened and brought into the esophagus and no  esophagitis and no esophageal varices were noted.  The scope was brought  out.  He tolerated the procedure well without complications.   IMPRESSION:  1. Diffuse gastritis.  2. Three antral ulcers which were small.  3. No evidence of esophageal varices.   COMMENTS:  I would treat this patient with Prevpac b.i.d. for  two weeks.  I  did not obtain a biopsy to check for a CLO-test for Helicobacter pylori  because of increased coags previously noted on this patient.  I will  empirically treat the ulcers and assume that he could have H pylori.  He  will be treated with Prevpac for two weeks.                                               Graylin Shiver, M.D.    Germain Osgood  D:  05/20/2002  T:  05/21/2002  Job:  [id number]

## 2010-05-24 NOTE — Assessment & Plan Note (Signed)
[facility]                            CARDIOLOGY OFFICE NOTE   THEODORO, KOVAL                        MRN:          [id number]  DATE:04/30/2006                            DOB:          10/01/1945    PRIMARY CARE PHYSICIAN:  [facility].   REASON FOR PRESENTATION:  Evaluate patient with shortness of breath.   HISTORY OF PRESENT ILLNESS:  The patient presents for followup.  I saw  him last in October.  He had shortness of breath.  However,  echocardiogram and a BNP level suggested that the etiology was  noncardiac.  He had had a stress test prior to this which demonstrated  no evidence of ischemia.   He has had no new chest discomfort.  He actually thinks that this is  better.  Despite this, he was seen in early February for 24-hour  observation for chest discomfort and dyspnea.  He ruled out and had no  further cardiovascular testing.  He still gets short of breath walking a  short distance on level ground.  He does not have any resting shortness  of breath.  Denies any PND or orthopnea.  He does not have any  palpitations, presyncope, or syncope.   PAST MEDICAL HISTORY:  1. Hypertension.  2. Cirrhosis.  3. Hepatitis C.  4. Knee replacement.  5. Tonsillectomy and jaw surgery.   ALLERGIES:  CODEINE.   CURRENT MEDICATIONS:  1. Hydrochlorothiazide 25 mg daily.  2. Norvasc 10 mg daily.  3. Hydralazine 25 mg b.i.d.  4. Propranolol 60 mg daily.  5. Enalapril 10 mg daily.  6. Prilosec 20 mg daily.   REVIEW OF SYSTEMS:  As stated in the HPI and otherwise negative for  other systems.   PHYSICAL EXAMINATION:  GENERAL:  The patient is in no distress.  VITAL SIGNS:  Blood pressure 160/72, heart rate 70 and regular.  Weight  252 pounds.  Body mass index 38.  HEENT:  Eyes unremarkable.  Pupils equal, round, reactive to light.  Fundi not visualized.  Oral mucosa unremarkable.  NECK:  No jugular venous distention to 45 degrees.   Carotid upstrokes  brisk and symmetrical, no bruits, no thyromegaly.  LYMPHATICS:  No adenopathy.  LUNGS:  Clear to auscultation bilaterally.  BACK:  No costovertebral angle tenderness.  CHEST:  Unremarkable.  HEART:  PMI not displaced or sustained, S1, S2 within normal limits.  No  S3, no S4.  No clicks, no rubs, no murmurs.  ABDOMEN:  Obese.  Positive bowel sounds, normal in frequency and pitch.  No bruits, no rebound, no guarding.  No midline pulsatile mass, no  organomegaly.  SKIN:  No rashes.  EXTREMITIES:  Pulses 2+.  Moderate bilateral lower extremity edema to  the mid calf.  No cyanosis or clubbing.  NEUROLOGIC:  Oriented to person, place, and time.  Cranial nerves II-XII  grossly intact, motor grossly intact.   EKG sinus rhythm, rate 67, axis within normal limits, intervals within  normal limits.  No acute ST-T wave changes.   ASSESSMENT/PLAN:  1. Dyspnea.  There does  not appear to be a cardiac etiology.  He says      he has had pulmonary function tests done recently.  I will try to      find these.  If these suggest that it would be helpful, I will send      him to a pulmonologist.  If I cannot find these, we will order some      and follow up.  2. Lower extremity swelling.  He says it is worse when he has been on      his feet for a while.  He is advised to keep his feet up and limit      his salt.  Alternatively, he could have some compression stockings      as well.  3. Hypertension.  He did not take his enalapril as he has been out of      it.  We will renew this.  He needs to restart this.  He is going to      have his pressure followed by his primary care physician.  4. Followup will be back in this clinic as needed.     Rollene Rotunda, MD, [facility]  Electronically Signed    JH/MedQ  DD: 04/30/2006  DT: 05/01/2006  Job #: [id number]   cc:   [facility]

## 2010-09-30 LAB — COMPREHENSIVE METABOLIC PANEL
Alkaline Phosphatase: 66
BUN: 13
Chloride: 102
GFR calc non Af Amer: >60
Glucose, Bld: 81
Potassium: 4
Total Bilirubin: 3.8 — ABNORMAL HIGH

## 2010-09-30 LAB — ETHANOL: Alcohol, Ethyl (B): <5

## 2010-09-30 LAB — URINALYSIS, ROUTINE W REFLEX MICROSCOPIC
Glucose, UA: NEGATIVE
Ketones, ur: 15 — AB
Protein, ur: NEGATIVE

## 2010-09-30 LAB — VITAMIN B12: Vitamin B-12: 537 (ref 211–911)

## 2010-09-30 LAB — DIFFERENTIAL
Basophils Absolute: 0
Eosinophils Relative: 2
Lymphocytes Relative: 40
Neutro Abs: 3.9
Neutrophils Relative %: 51

## 2010-09-30 LAB — RPR: RPR Ser Ql: NONREACTIVE

## 2010-09-30 LAB — CK TOTAL AND CKMB (NOT AT ARMC)
CK, MB: 2.1
Relative Index: 1.5

## 2010-09-30 LAB — CBC
Hemoglobin: 12.7 — ABNORMAL LOW
RBC: 3.91 — ABNORMAL LOW
WBC: 7.6

## 2010-09-30 LAB — PROTIME-INR
INR: 1.3
Prothrombin Time: 16.2 — ABNORMAL HIGH

## 2010-09-30 LAB — TSH: TSH: 2.515

## 2010-10-01 LAB — PROTIME-INR
INR: 1.3
INR: 1.4
Prothrombin Time: 16.1 — ABNORMAL HIGH
Prothrombin Time: 17.2 — ABNORMAL HIGH

## 2010-10-01 LAB — CBC
HCT: 34.3 — ABNORMAL LOW
HCT: 34.6 — ABNORMAL LOW
HCT: 34.8 — ABNORMAL LOW
HCT: 35.4 — ABNORMAL LOW
HCT: 37.1 — ABNORMAL LOW
HCT: 37.2 — ABNORMAL LOW
HCT: 37.9 — ABNORMAL LOW
HCT: 40.5
HCT: 41.7
Hemoglobin: 11.9 — ABNORMAL LOW
Hemoglobin: 12 — ABNORMAL LOW
Hemoglobin: 12.2 — ABNORMAL LOW
Hemoglobin: 12.7 — ABNORMAL LOW
Hemoglobin: 12.9 — ABNORMAL LOW
Hemoglobin: 13.1
Hemoglobin: 13.8
Hemoglobin: 14.2
MCHC: 34
MCHC: 34.1
MCHC: 34.3
MCHC: 34.4
MCHC: 34.6
MCHC: 34.6
MCHC: 34.7
MCHC: 34.9
MCV: 93.8
MCV: 93.9
MCV: 94.3
MCV: 94.5
MCV: 94.5
MCV: 94.7
Platelets: 118 — ABNORMAL LOW
Platelets: 152
Platelets: 51 — ABNORMAL LOW
Platelets: 67 — ABNORMAL LOW
Platelets: 68 — ABNORMAL LOW
Platelets: 81 — ABNORMAL LOW
RBC: 3.67 — ABNORMAL LOW
RBC: 3.93 — ABNORMAL LOW
RBC: 3.96 — ABNORMAL LOW
RBC: 4.04 — ABNORMAL LOW
RBC: 4.28
RBC: 4.37
RBC: 4.4
RDW: 14.4
RDW: 14.7
RDW: 15
RDW: 15.1
RDW: 15.2
RDW: 15.3
RDW: 15.5
RDW: 15.9 — ABNORMAL HIGH
WBC: 5.6
WBC: 7
WBC: 7.6
WBC: 8
WBC: 8.7
WBC: 9.7

## 2010-10-01 LAB — COMPREHENSIVE METABOLIC PANEL
ALT: 40
ALT: 40
ALT: 42
ALT: 42
AST: 52 — ABNORMAL HIGH
AST: 79 — ABNORMAL HIGH
Albumin: 2.8 — ABNORMAL LOW
Albumin: 2.8 — ABNORMAL LOW
Alkaline Phosphatase: 63
Alkaline Phosphatase: 68
Alkaline Phosphatase: 73
Alkaline Phosphatase: 88
Alkaline Phosphatase: 91
Alkaline Phosphatase: 94
BUN: 10
BUN: 11
BUN: 13
BUN: 21
BUN: 8
BUN: 9
BUN: 9
CO2: 25
CO2: 27
CO2: 28
CO2: 29
Calcium: 8.3 — ABNORMAL LOW
Calcium: 8.7
Calcium: 8.8
Calcium: 8.9
Calcium: 8.9
Calcium: 9.1
Chloride: 100
Chloride: 101
Chloride: 103
Chloride: 103
Chloride: 105
Creatinine, Ser: 0.76
Creatinine, Ser: 1.1
GFR calc Af Amer: >60
GFR calc Af Amer: >60
GFR calc Af Amer: >60
GFR calc Af Amer: >60
GFR calc non Af Amer: >60
GFR calc non Af Amer: >60
GFR calc non Af Amer: >60
GFR calc non Af Amer: >60
GFR calc non Af Amer: >60
GFR calc non Af Amer: >60
Glucose, Bld: 104 — ABNORMAL HIGH
Glucose, Bld: 116 — ABNORMAL HIGH
Glucose, Bld: 119 — ABNORMAL HIGH
Glucose, Bld: 127 — ABNORMAL HIGH
Glucose, Bld: 131 — ABNORMAL HIGH
Glucose, Bld: 91
Glucose, Bld: 93
Potassium: 3.5
Potassium: 3.5
Potassium: 4.1
Potassium: 4.2
Sodium: 134 — ABNORMAL LOW
Sodium: 136
Sodium: 136
Total Bilirubin: 1.9 — ABNORMAL HIGH
Total Bilirubin: 2.1 — ABNORMAL HIGH
Total Bilirubin: 2.7 — ABNORMAL HIGH
Total Bilirubin: 3 — ABNORMAL HIGH
Total Bilirubin: 3.5 — ABNORMAL HIGH
Total Protein: 7.1
Total Protein: 7.5
Total Protein: 7.7
Total Protein: 8.1
Total Protein: 9.2 — ABNORMAL HIGH

## 2010-10-01 LAB — CARDIAC PANEL(CRET KIN+CKTOT+MB+TROPI)
CK, MB: 1.1
CK, MB: 1.1
Relative Index: 1.3
Relative Index: INVALID
Relative Index: INVALID
Relative Index: INVALID
Relative Index: INVALID
Total CK: 23
Total CK: 32
Troponin I: 0.02
Troponin I: 0.24 — ABNORMAL HIGH
Troponin I: 0.24 — ABNORMAL HIGH

## 2010-10-01 LAB — URINE CULTURE
Colony Count: NO GROWTH
Culture: NO GROWTH
Special Requests: NEGATIVE

## 2010-10-01 LAB — COMPREHENSIVE METABOLIC PANEL WITH GFR
ALT: 53
AST: 79 — ABNORMAL HIGH
Albumin: 3 — ABNORMAL LOW
Alkaline Phosphatase: 79
BUN: 20
CO2: 27
Calcium: 8.8
Chloride: 103
Creatinine, Ser: 0.86
GFR calc non Af Amer: >60
Glucose, Bld: 95
Potassium: 3.8
Sodium: 138
Total Bilirubin: 1.9 — ABNORMAL HIGH
Total Protein: 8.6 — ABNORMAL HIGH

## 2010-10-01 LAB — CULTURE, BLOOD (ROUTINE X 2): Culture: NO GROWTH

## 2010-10-01 LAB — URINE MICROSCOPIC-ADD ON

## 2010-10-01 LAB — URINALYSIS, ROUTINE W REFLEX MICROSCOPIC
Ketones, ur: 15 — AB
Nitrite: NEGATIVE
Specific Gravity, Urine: 1.022
pH: 6

## 2010-10-01 LAB — BLOOD GAS, ARTERIAL
FIO2: 0.5
O2 Saturation: 97.3
pCO2 arterial: 37.9
pO2, Arterial: 91.4

## 2010-10-01 LAB — GRAM STAIN: Gram Stain: NEGATIVE

## 2010-10-01 LAB — AMMONIA
Ammonia: 26
Ammonia: 33

## 2010-10-08 LAB — CARDIAC PANEL(CRET KIN+CKTOT+MB+TROPI)
CK, MB: 0.5 ng/mL (ref 0.3–4.0)
CK, MB: 0.6 ng/mL (ref 0.3–4.0)
CK, MB: 0.7 ng/mL (ref 0.3–4.0)
CK, MB: 0.8 ng/mL (ref 0.3–4.0)
Relative Index: INVALID (ref 0.0–2.5)
Total CK: 36 U/L (ref 7–232)
Total CK: 40 U/L (ref 7–232)
Total CK: 41 U/L (ref 7–232)
Troponin I: 0.02 ng/mL (ref 0.00–0.06)
Troponin I: 0.02 ng/mL (ref 0.00–0.06)
Troponin I: 0.02 ng/mL (ref 0.00–0.06)

## 2010-10-08 LAB — LIPID PANEL
Cholesterol: 119 mg/dL (ref 0–200)
LDL Cholesterol: 79 mg/dL (ref 0–99)
VLDL: 12 mg/dL (ref 0–40)

## 2010-10-08 LAB — DIFFERENTIAL
Eosinophils Absolute: 0 10*3/uL (ref 0.0–0.7)
Eosinophils Relative: 1 % (ref 0–5)
Lymphocytes Relative: 27 % (ref 12–46)
Lymphs Abs: 0.2 10*3/uL — ABNORMAL LOW (ref 0.7–4.0)
Lymphs Abs: 1.4 10*3/uL (ref 0.7–4.0)
Monocytes Absolute: 0 10*3/uL — ABNORMAL LOW (ref 0.1–1.0)
Monocytes Absolute: 0.5 10*3/uL (ref 0.1–1.0)
Monocytes Relative: 9 % (ref 3–12)
Neutro Abs: 3.2 10*3/uL (ref 1.7–7.7)

## 2010-10-08 LAB — URINALYSIS, ROUTINE W REFLEX MICROSCOPIC
Hgb urine dipstick: NEGATIVE
Nitrite: POSITIVE — AB
Specific Gravity, Urine: 1.03 (ref 1.005–1.030)
Urobilinogen, UA: 1 mg/dL (ref 0.0–1.0)
pH: 5 (ref 5.0–8.0)

## 2010-10-08 LAB — COMPREHENSIVE METABOLIC PANEL
ALT: 107 U/L — ABNORMAL HIGH (ref 0–53)
ALT: 193 U/L — ABNORMAL HIGH (ref 0–53)
ALT: 211 U/L — ABNORMAL HIGH (ref 0–53)
ALT: 99 U/L — ABNORMAL HIGH (ref 0–53)
AST: 133 U/L — ABNORMAL HIGH (ref 0–37)
AST: 142 U/L — ABNORMAL HIGH (ref 0–37)
AST: 242 U/L — ABNORMAL HIGH (ref 0–37)
Albumin: 2.2 g/dL — ABNORMAL LOW (ref 3.5–5.2)
Albumin: 2.3 g/dL — ABNORMAL LOW (ref 3.5–5.2)
Albumin: 2.3 g/dL — ABNORMAL LOW (ref 3.5–5.2)
Alkaline Phosphatase: 100 U/L (ref 39–117)
Alkaline Phosphatase: 66 U/L (ref 39–117)
Alkaline Phosphatase: 74 U/L (ref 39–117)
BUN: 10 mg/dL (ref 6–23)
CO2: 22 mEq/L (ref 19–32)
CO2: 23 mEq/L (ref 19–32)
Calcium: 7.7 mg/dL — ABNORMAL LOW (ref 8.4–10.5)
Chloride: 103 mEq/L (ref 96–112)
Chloride: 107 mEq/L (ref 96–112)
Chloride: 110 mEq/L (ref 96–112)
Creatinine, Ser: 1.83 mg/dL — ABNORMAL HIGH (ref 0.4–1.5)
GFR calc Af Amer: 45 mL/min — ABNORMAL LOW (ref >60)
GFR calc Af Amer: >60 mL/min (ref >60)
GFR calc Af Amer: >60 mL/min (ref >60)
GFR calc Af Amer: >60 mL/min (ref >60)
GFR calc non Af Amer: 37 mL/min — ABNORMAL LOW (ref >60)
Glucose, Bld: 110 mg/dL — ABNORMAL HIGH (ref 70–99)
Glucose, Bld: 78 mg/dL (ref 70–99)
Glucose, Bld: 99 mg/dL (ref 70–99)
Potassium: 3.3 mEq/L — ABNORMAL LOW (ref 3.5–5.1)
Potassium: 3.7 mEq/L (ref 3.5–5.1)
Potassium: 4 mEq/L (ref 3.5–5.1)
Potassium: 4.1 mEq/L (ref 3.5–5.1)
Sodium: 133 mEq/L — ABNORMAL LOW (ref 135–145)
Sodium: 135 mEq/L (ref 135–145)
Sodium: 135 mEq/L (ref 135–145)
Sodium: 137 mEq/L (ref 135–145)
Total Bilirubin: 1.5 mg/dL — ABNORMAL HIGH (ref 0.3–1.2)
Total Bilirubin: 1.5 mg/dL — ABNORMAL HIGH (ref 0.3–1.2)
Total Bilirubin: 2.1 mg/dL — ABNORMAL HIGH (ref 0.3–1.2)
Total Protein: 6.5 g/dL (ref 6.0–8.3)
Total Protein: 8.1 g/dL (ref 6.0–8.3)

## 2010-10-08 LAB — RAPID URINE DRUG SCREEN, HOSP PERFORMED
Benzodiazepines: NOT DETECTED
Cocaine: NOT DETECTED
Opiates: POSITIVE — AB
Tetrahydrocannabinol: POSITIVE — AB

## 2010-10-08 LAB — AMMONIA: Ammonia: 45 umol/L — ABNORMAL HIGH (ref 11–35)

## 2010-10-08 LAB — APTT: aPTT: 32 seconds (ref 24–37)

## 2010-10-08 LAB — CULTURE, BLOOD (ROUTINE X 2)

## 2010-10-08 LAB — CBC
HCT: 32.5 % — ABNORMAL LOW (ref 39.0–52.0)
HCT: 35.5 % — ABNORMAL LOW (ref 39.0–52.0)
Hemoglobin: 10.8 g/dL — ABNORMAL LOW (ref 13.0–17.0)
Hemoglobin: 10.9 g/dL — ABNORMAL LOW (ref 13.0–17.0)
Hemoglobin: 11.6 g/dL — ABNORMAL LOW (ref 13.0–17.0)
Hemoglobin: 9.3 g/dL — ABNORMAL LOW (ref 13.0–17.0)
MCHC: 34.6 g/dL (ref 30.0–36.0)
MCV: 88.5 fL (ref 78.0–100.0)
Platelets: 36 10*3/uL — CL (ref 150–400)
Platelets: 38 10*3/uL — CL (ref 150–400)
Platelets: 40 10*3/uL — CL (ref 150–400)
Platelets: 63 10*3/uL — ABNORMAL LOW (ref 150–400)
RBC: 3.07 MIL/uL — ABNORMAL LOW (ref 4.22–5.81)
RBC: 3.19 MIL/uL — ABNORMAL LOW (ref 4.22–5.81)
RBC: 3.64 MIL/uL — ABNORMAL LOW (ref 4.22–5.81)
RDW: 15.6 % — ABNORMAL HIGH (ref 11.5–15.5)
RDW: 17.1 % — ABNORMAL HIGH (ref 11.5–15.5)
RDW: 17.1 % — ABNORMAL HIGH (ref 11.5–15.5)
WBC: 2.8 10*3/uL — ABNORMAL LOW (ref 4.0–10.5)
WBC: 5.6 10*3/uL (ref 4.0–10.5)
WBC: 6.3 10*3/uL (ref 4.0–10.5)

## 2010-10-08 LAB — POCT CARDIAC MARKERS
CKMB, poc: 1.1 ng/mL (ref 1.0–8.0)
Myoglobin, poc: 56.2 ng/mL (ref 12–200)
Troponin i, poc: <0.05 ng/mL (ref 0.00–0.09)

## 2010-10-08 LAB — URINE MICROSCOPIC-ADD ON

## 2010-10-08 LAB — OCCULT BLOOD X 1 CARD TO LAB, STOOL: Fecal Occult Bld: POSITIVE

## 2010-10-08 LAB — BODY FLUID CULTURE

## 2010-10-08 LAB — TROPONIN I: Troponin I: 0.02 ng/mL (ref 0.00–0.06)

## 2010-10-08 LAB — CK TOTAL AND CKMB (NOT AT ARMC): CK, MB: 0.9 ng/mL (ref 0.3–4.0)

## 2010-10-08 LAB — PROTIME-INR: INR: 1.2 (ref 0.00–1.49)

## 2010-10-10 LAB — COMPREHENSIVE METABOLIC PANEL
ALT: 119 U/L — ABNORMAL HIGH (ref 0–53)
ALT: 84 U/L — ABNORMAL HIGH (ref 0–53)
ALT: 85 U/L — ABNORMAL HIGH (ref 0–53)
ALT: 89 U/L — ABNORMAL HIGH (ref 0–53)
AST: 101 U/L — ABNORMAL HIGH (ref 0–37)
AST: 110 U/L — ABNORMAL HIGH (ref 0–37)
AST: 84 U/L — ABNORMAL HIGH (ref 0–37)
AST: 86 U/L — ABNORMAL HIGH (ref 0–37)
AST: 95 U/L — ABNORMAL HIGH (ref 0–37)
Albumin: 2 g/dL — ABNORMAL LOW (ref 3.5–5.2)
Albumin: 2.1 g/dL — ABNORMAL LOW (ref 3.5–5.2)
Albumin: 2.1 g/dL — ABNORMAL LOW (ref 3.5–5.2)
Albumin: 2.1 g/dL — ABNORMAL LOW (ref 3.5–5.2)
Albumin: 2.2 g/dL — ABNORMAL LOW (ref 3.5–5.2)
Albumin: 2.3 g/dL — ABNORMAL LOW (ref 3.5–5.2)
Albumin: 2.6 g/dL — ABNORMAL LOW (ref 3.5–5.2)
Albumin: 2.7 g/dL — ABNORMAL LOW (ref 3.5–5.2)
Albumin: 3 g/dL — ABNORMAL LOW (ref 3.5–5.2)
Alkaline Phosphatase: 102 U/L (ref 39–117)
Alkaline Phosphatase: 105 U/L (ref 39–117)
Alkaline Phosphatase: 73 U/L (ref 39–117)
Alkaline Phosphatase: 76 U/L (ref 39–117)
Alkaline Phosphatase: 77 U/L (ref 39–117)
Alkaline Phosphatase: 77 U/L (ref 39–117)
Alkaline Phosphatase: 81 U/L (ref 39–117)
Alkaline Phosphatase: 91 U/L (ref 39–117)
BUN: 22 mg/dL (ref 6–23)
BUN: 27 mg/dL — ABNORMAL HIGH (ref 6–23)
BUN: 30 mg/dL — ABNORMAL HIGH (ref 6–23)
BUN: 31 mg/dL — ABNORMAL HIGH (ref 6–23)
BUN: 31 mg/dL — ABNORMAL HIGH (ref 6–23)
BUN: 33 mg/dL — ABNORMAL HIGH (ref 6–23)
BUN: 38 mg/dL — ABNORMAL HIGH (ref 6–23)
BUN: 39 mg/dL — ABNORMAL HIGH (ref 6–23)
CO2: 17 mEq/L — ABNORMAL LOW (ref 19–32)
CO2: 18 mEq/L — ABNORMAL LOW (ref 19–32)
CO2: 22 mEq/L (ref 19–32)
CO2: 23 mEq/L (ref 19–32)
Calcium: 8.4 mg/dL (ref 8.4–10.5)
Calcium: 8.4 mg/dL (ref 8.4–10.5)
Calcium: 8.4 mg/dL (ref 8.4–10.5)
Calcium: 8.4 mg/dL (ref 8.4–10.5)
Chloride: 103 mEq/L (ref 96–112)
Chloride: 108 mEq/L (ref 96–112)
Chloride: 109 mEq/L (ref 96–112)
Chloride: 109 mEq/L (ref 96–112)
Chloride: 111 mEq/L (ref 96–112)
Chloride: 111 mEq/L (ref 96–112)
Creatinine, Ser: 1.25 mg/dL (ref 0.4–1.5)
Creatinine, Ser: 1.26 mg/dL (ref 0.4–1.5)
Creatinine, Ser: 1.48 mg/dL (ref 0.4–1.5)
Creatinine, Ser: 1.56 mg/dL — ABNORMAL HIGH (ref 0.4–1.5)
Creatinine, Ser: 1.72 mg/dL — ABNORMAL HIGH (ref 0.4–1.5)
Creatinine, Ser: 1.96 mg/dL — ABNORMAL HIGH (ref 0.4–1.5)
GFR calc Af Amer: 33 mL/min — ABNORMAL LOW (ref >60)
GFR calc Af Amer: 42 mL/min — ABNORMAL LOW (ref >60)
GFR calc Af Amer: 49 mL/min — ABNORMAL LOW (ref >60)
GFR calc Af Amer: 52 mL/min — ABNORMAL LOW (ref >60)
GFR calc non Af Amer: 40 mL/min — ABNORMAL LOW (ref >60)
GFR calc non Af Amer: 43 mL/min — ABNORMAL LOW (ref >60)
GFR calc non Af Amer: 45 mL/min — ABNORMAL LOW (ref >60)
GFR calc non Af Amer: 58 mL/min — ABNORMAL LOW (ref >60)
Glucose, Bld: 58 mg/dL — ABNORMAL LOW (ref 70–99)
Glucose, Bld: 70 mg/dL (ref 70–99)
Glucose, Bld: 74 mg/dL (ref 70–99)
Glucose, Bld: 91 mg/dL (ref 70–99)
Potassium: 4 mEq/L (ref 3.5–5.1)
Potassium: 4.7 mEq/L (ref 3.5–5.1)
Potassium: 4.9 mEq/L (ref 3.5–5.1)
Potassium: 5 mEq/L (ref 3.5–5.1)
Potassium: 5 mEq/L (ref 3.5–5.1)
Potassium: 5.2 mEq/L — ABNORMAL HIGH (ref 3.5–5.1)
Potassium: 5.5 mEq/L — ABNORMAL HIGH (ref 3.5–5.1)
Potassium: 5.5 mEq/L — ABNORMAL HIGH (ref 3.5–5.1)
Potassium: 5.7 mEq/L — ABNORMAL HIGH (ref 3.5–5.1)
Sodium: 133 mEq/L — ABNORMAL LOW (ref 135–145)
Sodium: 134 mEq/L — ABNORMAL LOW (ref 135–145)
Sodium: 134 mEq/L — ABNORMAL LOW (ref 135–145)
Total Bilirubin: 1.5 mg/dL — ABNORMAL HIGH (ref 0.3–1.2)
Total Bilirubin: 1.8 mg/dL — ABNORMAL HIGH (ref 0.3–1.2)
Total Bilirubin: 2 mg/dL — ABNORMAL HIGH (ref 0.3–1.2)
Total Bilirubin: 2.2 mg/dL — ABNORMAL HIGH (ref 0.3–1.2)
Total Bilirubin: 2.3 mg/dL — ABNORMAL HIGH (ref 0.3–1.2)
Total Bilirubin: 2.8 mg/dL — ABNORMAL HIGH (ref 0.3–1.2)
Total Protein: 6.5 g/dL (ref 6.0–8.3)
Total Protein: 7 g/dL (ref 6.0–8.3)
Total Protein: 7 g/dL (ref 6.0–8.3)
Total Protein: 7.1 g/dL (ref 6.0–8.3)
Total Protein: 7.2 g/dL (ref 6.0–8.3)
Total Protein: 7.6 g/dL (ref 6.0–8.3)
Total Protein: 8.7 g/dL — ABNORMAL HIGH (ref 6.0–8.3)

## 2010-10-10 LAB — BASIC METABOLIC PANEL
BUN: 10 mg/dL (ref 6–23)
BUN: 10 mg/dL (ref 6–23)
BUN: 13 mg/dL (ref 6–23)
BUN: 17 mg/dL (ref 6–23)
BUN: 19 mg/dL (ref 6–23)
BUN: 22 mg/dL (ref 6–23)
BUN: 25 mg/dL — ABNORMAL HIGH (ref 6–23)
BUN: 26 mg/dL — ABNORMAL HIGH (ref 6–23)
BUN: 27 mg/dL — ABNORMAL HIGH (ref 6–23)
BUN: 31 mg/dL — ABNORMAL HIGH (ref 6–23)
BUN: 32 mg/dL — ABNORMAL HIGH (ref 6–23)
BUN: 42 mg/dL — ABNORMAL HIGH (ref 6–23)
CO2: 18 mEq/L — ABNORMAL LOW (ref 19–32)
CO2: 19 mEq/L (ref 19–32)
CO2: 19 mEq/L (ref 19–32)
CO2: 20 mEq/L (ref 19–32)
CO2: 21 mEq/L (ref 19–32)
CO2: 23 mEq/L (ref 19–32)
CO2: 24 mEq/L (ref 19–32)
CO2: 25 mEq/L (ref 19–32)
CO2: 25 mEq/L (ref 19–32)
Calcium: 8 mg/dL — ABNORMAL LOW (ref 8.4–10.5)
Calcium: 8.1 mg/dL — ABNORMAL LOW (ref 8.4–10.5)
Calcium: 8.2 mg/dL — ABNORMAL LOW (ref 8.4–10.5)
Calcium: 8.2 mg/dL — ABNORMAL LOW (ref 8.4–10.5)
Chloride: 104 mEq/L (ref 96–112)
Chloride: 104 mEq/L (ref 96–112)
Chloride: 105 mEq/L (ref 96–112)
Chloride: 108 mEq/L (ref 96–112)
Chloride: 109 mEq/L (ref 96–112)
Chloride: 109 mEq/L (ref 96–112)
Chloride: 110 mEq/L (ref 96–112)
Chloride: 110 mEq/L (ref 96–112)
Chloride: 111 mEq/L (ref 96–112)
Chloride: 111 mEq/L (ref 96–112)
Creatinine, Ser: 0.71 mg/dL (ref 0.4–1.5)
Creatinine, Ser: 0.72 mg/dL (ref 0.4–1.5)
Creatinine, Ser: 1.02 mg/dL (ref 0.4–1.5)
Creatinine, Ser: 1.1 mg/dL (ref 0.4–1.5)
Creatinine, Ser: 1.22 mg/dL (ref 0.4–1.5)
Creatinine, Ser: 1.24 mg/dL (ref 0.4–1.5)
GFR calc Af Amer: 53 mL/min — ABNORMAL LOW (ref >60)
GFR calc Af Amer: >60 mL/min (ref >60)
GFR calc Af Amer: >60 mL/min (ref >60)
GFR calc Af Amer: >60 mL/min (ref >60)
GFR calc Af Amer: >60 mL/min (ref >60)
GFR calc Af Amer: >60 mL/min (ref >60)
GFR calc non Af Amer: 38 mL/min — ABNORMAL LOW (ref >60)
GFR calc non Af Amer: 43 mL/min — ABNORMAL LOW (ref >60)
GFR calc non Af Amer: 57 mL/min — ABNORMAL LOW (ref >60)
GFR calc non Af Amer: 59 mL/min — ABNORMAL LOW (ref >60)
GFR calc non Af Amer: 59 mL/min — ABNORMAL LOW (ref >60)
GFR calc non Af Amer: >60 mL/min (ref >60)
GFR calc non Af Amer: >60 mL/min (ref >60)
Glucose, Bld: 104 mg/dL — ABNORMAL HIGH (ref 70–99)
Glucose, Bld: 110 mg/dL — ABNORMAL HIGH (ref 70–99)
Glucose, Bld: 113 mg/dL — ABNORMAL HIGH (ref 70–99)
Glucose, Bld: 116 mg/dL — ABNORMAL HIGH (ref 70–99)
Glucose, Bld: 119 mg/dL — ABNORMAL HIGH (ref 70–99)
Glucose, Bld: 90 mg/dL (ref 70–99)
Glucose, Bld: 94 mg/dL (ref 70–99)
Glucose, Bld: 95 mg/dL (ref 70–99)
Potassium: 3.6 mEq/L (ref 3.5–5.1)
Potassium: 3.7 mEq/L (ref 3.5–5.1)
Potassium: 3.9 mEq/L (ref 3.5–5.1)
Potassium: 3.9 mEq/L (ref 3.5–5.1)
Potassium: 4.2 mEq/L (ref 3.5–5.1)
Potassium: 4.3 mEq/L (ref 3.5–5.1)
Potassium: 4.6 mEq/L (ref 3.5–5.1)
Potassium: 4.8 mEq/L (ref 3.5–5.1)
Potassium: 4.9 mEq/L (ref 3.5–5.1)
Potassium: 5.6 mEq/L — ABNORMAL HIGH (ref 3.5–5.1)
Potassium: 5.7 mEq/L — ABNORMAL HIGH (ref 3.5–5.1)
Sodium: 133 mEq/L — ABNORMAL LOW (ref 135–145)
Sodium: 134 mEq/L — ABNORMAL LOW (ref 135–145)
Sodium: 134 mEq/L — ABNORMAL LOW (ref 135–145)
Sodium: 137 mEq/L (ref 135–145)
Sodium: 137 mEq/L (ref 135–145)
Sodium: 139 mEq/L (ref 135–145)
Sodium: 140 mEq/L (ref 135–145)

## 2010-10-10 LAB — CBC
HCT: 29.8 % — ABNORMAL LOW (ref 39.0–52.0)
HCT: 31.1 % — ABNORMAL LOW (ref 39.0–52.0)
HCT: 31.4 % — ABNORMAL LOW (ref 39.0–52.0)
HCT: 33.4 % — ABNORMAL LOW (ref 39.0–52.0)
HCT: 33.4 % — ABNORMAL LOW (ref 39.0–52.0)
HCT: 33.6 % — ABNORMAL LOW (ref 39.0–52.0)
HCT: 33.6 % — ABNORMAL LOW (ref 39.0–52.0)
HCT: 33.6 % — ABNORMAL LOW (ref 39.0–52.0)
HCT: 34.1 % — ABNORMAL LOW (ref 39.0–52.0)
HCT: 34.2 % — ABNORMAL LOW (ref 39.0–52.0)
HCT: 34.3 % — ABNORMAL LOW (ref 39.0–52.0)
HCT: 36.3 % — ABNORMAL LOW (ref 39.0–52.0)
HCT: 36.9 % — ABNORMAL LOW (ref 39.0–52.0)
HCT: 38.5 % — ABNORMAL LOW (ref 39.0–52.0)
Hemoglobin: 10.4 g/dL — ABNORMAL LOW (ref 13.0–17.0)
Hemoglobin: 10.6 g/dL — ABNORMAL LOW (ref 13.0–17.0)
Hemoglobin: 10.9 g/dL — ABNORMAL LOW (ref 13.0–17.0)
Hemoglobin: 11 g/dL — ABNORMAL LOW (ref 13.0–17.0)
Hemoglobin: 11.1 g/dL — ABNORMAL LOW (ref 13.0–17.0)
Hemoglobin: 11.2 g/dL — ABNORMAL LOW (ref 13.0–17.0)
MCHC: 32.2 g/dL (ref 30.0–36.0)
MCHC: 32.4 g/dL (ref 30.0–36.0)
MCHC: 32.6 g/dL (ref 30.0–36.0)
MCHC: 32.6 g/dL (ref 30.0–36.0)
MCHC: 32.8 g/dL (ref 30.0–36.0)
MCHC: 32.8 g/dL (ref 30.0–36.0)
MCHC: 33.1 g/dL (ref 30.0–36.0)
MCHC: 33.4 g/dL (ref 30.0–36.0)
MCHC: 33.7 g/dL (ref 30.0–36.0)
MCV: 88.4 fL (ref 78.0–100.0)
MCV: 88.6 fL (ref 78.0–100.0)
MCV: 88.6 fL (ref 78.0–100.0)
MCV: 88.7 fL (ref 78.0–100.0)
MCV: 89 fL (ref 78.0–100.0)
MCV: 89.1 fL (ref 78.0–100.0)
MCV: 89.1 fL (ref 78.0–100.0)
MCV: 89.2 fL (ref 78.0–100.0)
MCV: 89.3 fL (ref 78.0–100.0)
MCV: 89.3 fL (ref 78.0–100.0)
MCV: 89.9 fL (ref 78.0–100.0)
Platelets: 113 10*3/uL — ABNORMAL LOW (ref 150–400)
Platelets: 113 10*3/uL — ABNORMAL LOW (ref 150–400)
Platelets: 116 10*3/uL — ABNORMAL LOW (ref 150–400)
Platelets: 121 10*3/uL — ABNORMAL LOW (ref 150–400)
Platelets: 138 10*3/uL — ABNORMAL LOW (ref 150–400)
Platelets: 175 10*3/uL (ref 150–400)
Platelets: 72 10*3/uL — ABNORMAL LOW (ref 150–400)
Platelets: 78 10*3/uL — ABNORMAL LOW (ref 150–400)
Platelets: 79 10*3/uL — ABNORMAL LOW (ref 150–400)
Platelets: 83 10*3/uL — ABNORMAL LOW (ref 150–400)
Platelets: 83 10*3/uL — ABNORMAL LOW (ref 150–400)
Platelets: 84 10*3/uL — ABNORMAL LOW (ref 150–400)
RBC: 3.36 MIL/uL — ABNORMAL LOW (ref 4.22–5.81)
RBC: 3.49 MIL/uL — ABNORMAL LOW (ref 4.22–5.81)
RBC: 3.52 MIL/uL — ABNORMAL LOW (ref 4.22–5.81)
RBC: 3.75 MIL/uL — ABNORMAL LOW (ref 4.22–5.81)
RBC: 3.79 MIL/uL — ABNORMAL LOW (ref 4.22–5.81)
RBC: 3.79 MIL/uL — ABNORMAL LOW (ref 4.22–5.81)
RBC: 3.82 MIL/uL — ABNORMAL LOW (ref 4.22–5.81)
RBC: 3.85 MIL/uL — ABNORMAL LOW (ref 4.22–5.81)
RBC: 3.87 MIL/uL — ABNORMAL LOW (ref 4.22–5.81)
RBC: 4.03 MIL/uL — ABNORMAL LOW (ref 4.22–5.81)
RDW: 16.7 % — ABNORMAL HIGH (ref 11.5–15.5)
RDW: 16.8 % — ABNORMAL HIGH (ref 11.5–15.5)
RDW: 16.9 % — ABNORMAL HIGH (ref 11.5–15.5)
RDW: 18.6 % — ABNORMAL HIGH (ref 11.5–15.5)
RDW: 18.9 % — ABNORMAL HIGH (ref 11.5–15.5)
RDW: 19.1 % — ABNORMAL HIGH (ref 11.5–15.5)
RDW: 19.3 % — ABNORMAL HIGH (ref 11.5–15.5)
RDW: 19.7 % — ABNORMAL HIGH (ref 11.5–15.5)
RDW: 19.8 % — ABNORMAL HIGH (ref 11.5–15.5)
RDW: 19.9 % — ABNORMAL HIGH (ref 11.5–15.5)
RDW: 20 % — ABNORMAL HIGH (ref 11.5–15.5)
RDW: 20.1 % — ABNORMAL HIGH (ref 11.5–15.5)
RDW: 20.3 % — ABNORMAL HIGH (ref 11.5–15.5)
WBC: 10.1 10*3/uL (ref 4.0–10.5)
WBC: 3.9 10*3/uL — ABNORMAL LOW (ref 4.0–10.5)
WBC: 4.7 10*3/uL (ref 4.0–10.5)
WBC: 4.7 10*3/uL (ref 4.0–10.5)
WBC: 5.1 10*3/uL (ref 4.0–10.5)
WBC: 5.1 10*3/uL (ref 4.0–10.5)
WBC: 5.2 10*3/uL (ref 4.0–10.5)
WBC: 5.3 10*3/uL (ref 4.0–10.5)
WBC: 5.7 10*3/uL (ref 4.0–10.5)
WBC: 5.7 10*3/uL (ref 4.0–10.5)
WBC: 7.2 10*3/uL (ref 4.0–10.5)
WBC: 8.8 10*3/uL (ref 4.0–10.5)

## 2010-10-10 LAB — DIFFERENTIAL
Eosinophils Relative: 1 % (ref 0–5)
Lymphocytes Relative: 21 % (ref 12–46)
Lymphocytes Relative: 21 % (ref 12–46)
Lymphs Abs: 1.6 10*3/uL (ref 0.7–4.0)
Lymphs Abs: 2.1 10*3/uL (ref 0.7–4.0)
Monocytes Absolute: 0.6 10*3/uL (ref 0.1–1.0)
Monocytes Relative: 8 % (ref 3–12)
Neutro Abs: 5.5 10*3/uL (ref 1.7–7.7)
Neutrophils Relative %: 70 % (ref 43–77)

## 2010-10-10 LAB — URINALYSIS, ROUTINE W REFLEX MICROSCOPIC
Bilirubin Urine: NEGATIVE
Glucose, UA: NEGATIVE mg/dL
Hgb urine dipstick: NEGATIVE
Ketones, ur: 15 mg/dL — AB
Protein, ur: 30 mg/dL — AB
Specific Gravity, Urine: 1.017 (ref 1.005–1.030)
Urobilinogen, UA: 0.2 mg/dL (ref 0.0–1.0)
Urobilinogen, UA: 0.2 mg/dL (ref 0.0–1.0)

## 2010-10-10 LAB — URINE MICROSCOPIC-ADD ON

## 2010-10-10 LAB — HEPATIC FUNCTION PANEL
ALT: 130 U/L — ABNORMAL HIGH (ref 0–53)
ALT: 73 U/L — ABNORMAL HIGH (ref 0–53)
AST: 67 U/L — ABNORMAL HIGH (ref 0–37)
Alkaline Phosphatase: 60 U/L (ref 39–117)
Bilirubin, Direct: 0.4 mg/dL — ABNORMAL HIGH (ref 0.0–0.3)
Bilirubin, Direct: 0.4 mg/dL — ABNORMAL HIGH (ref 0.0–0.3)
Bilirubin, Direct: 0.6 mg/dL — ABNORMAL HIGH (ref 0.0–0.3)
Indirect Bilirubin: 0.8 mg/dL (ref 0.3–0.9)
Indirect Bilirubin: 1 mg/dL — ABNORMAL HIGH (ref 0.3–0.9)
Total Bilirubin: 1 mg/dL (ref 0.3–1.2)
Total Protein: 6.7 g/dL (ref 6.0–8.3)

## 2010-10-10 LAB — LIPASE, BLOOD
Lipase: 132 U/L — ABNORMAL HIGH (ref 11–59)
Lipase: 144 U/L — ABNORMAL HIGH (ref 11–59)
Lipase: 175 U/L — ABNORMAL HIGH (ref 11–59)
Lipase: 63 U/L — ABNORMAL HIGH (ref 11–59)
Lipase: 93 U/L — ABNORMAL HIGH (ref 11–59)

## 2010-10-10 LAB — ALBUMIN: Albumin: 2.4 g/dL — ABNORMAL LOW (ref 3.5–5.2)

## 2010-10-10 LAB — CULTURE, BLOOD (ROUTINE X 2)

## 2010-10-10 LAB — BODY FLUID CELL COUNT WITH DIFFERENTIAL
Lymphs, Fluid: 93 %
Monocyte-Macrophage-Serous Fluid: 1 % — ABNORMAL LOW (ref 50–90)
Neutrophil Count, Fluid: 6 % (ref 0–25)
Total Nucleated Cell Count, Fluid: 910 cu mm (ref 0–1000)

## 2010-10-10 LAB — BODY FLUID CULTURE: Gram Stain: NONE SEEN

## 2010-10-10 LAB — PATHOLOGIST SMEAR REVIEW

## 2010-10-10 LAB — OCCULT BLOOD X 1 CARD TO LAB, STOOL: Fecal Occult Bld: NEGATIVE

## 2010-10-10 LAB — CLOSTRIDIUM DIFFICILE EIA: C difficile Toxins A+B, EIA: NEGATIVE

## 2010-10-10 LAB — ALBUMIN, FLUID (OTHER): Albumin, Fluid: <1 g/dL

## 2010-10-10 LAB — GLUCOSE, CAPILLARY: Glucose-Capillary: 113 mg/dL — ABNORMAL HIGH (ref 70–99)

## 2010-10-10 LAB — GRAM STAIN

## 2010-10-10 LAB — AMYLASE: Amylase: 106 U/L (ref 27–131)

## 2010-10-10 LAB — POTASSIUM: Potassium: 5 mEq/L (ref 3.5–5.1)

## 2010-10-10 LAB — AMMONIA
Ammonia: 21 umol/L (ref 11–35)
Ammonia: 25 umol/L (ref 11–35)

## 2013-08-24 ENCOUNTER — Encounter: Payer: Self-pay | Admitting: Internal Medicine

## 2013-08-24 NOTE — Progress Notes (Signed)
This encounter was created in error - please disregard.
